# Patient Record
Sex: Female | Born: 1940 | Race: White | Hispanic: No | Smoking: Never smoker
Health system: Southern US, Community
[De-identification: ages and names within clinical notes are randomized; demographics above are authoritative.]

## PROBLEM LIST (undated history)

## (undated) DIAGNOSIS — R519 Headache, unspecified: Secondary | ICD-10-CM

## (undated) DIAGNOSIS — J189 Pneumonia, unspecified organism: Secondary | ICD-10-CM

## (undated) DIAGNOSIS — R29898 Other symptoms and signs involving the musculoskeletal system: Secondary | ICD-10-CM

## (undated) DIAGNOSIS — I469 Cardiac arrest, cause unspecified: Secondary | ICD-10-CM

## (undated) DIAGNOSIS — L309 Dermatitis, unspecified: Secondary | ICD-10-CM

## (undated) DIAGNOSIS — N189 Chronic kidney disease, unspecified: Secondary | ICD-10-CM

## (undated) DIAGNOSIS — I456 Pre-excitation syndrome: Secondary | ICD-10-CM

## (undated) DIAGNOSIS — R600 Localized edema: Secondary | ICD-10-CM

## (undated) DIAGNOSIS — T8859XA Other complications of anesthesia, initial encounter: Secondary | ICD-10-CM

## (undated) DIAGNOSIS — R51 Headache: Secondary | ICD-10-CM

## (undated) DIAGNOSIS — E039 Hypothyroidism, unspecified: Secondary | ICD-10-CM

## (undated) DIAGNOSIS — T4145XA Adverse effect of unspecified anesthetic, initial encounter: Secondary | ICD-10-CM

## (undated) HISTORY — PX: OTHER SURGICAL HISTORY: SHX169

## (undated) HISTORY — PX: CATARACT EXTRACTION: SUR2

## (undated) HISTORY — PX: ABDOMINAL HYSTERECTOMY: SHX81

## (undated) HISTORY — PX: BREAST ENHANCEMENT SURGERY: SHX7

---

## 1988-08-20 DIAGNOSIS — I469 Cardiac arrest, cause unspecified: Secondary | ICD-10-CM

## 1988-08-20 HISTORY — DX: Cardiac arrest, cause unspecified: I46.9

## 2014-09-20 ENCOUNTER — Emergency Department: Payer: Self-pay | Admitting: Emergency Medicine

## 2014-09-20 LAB — BASIC METABOLIC PANEL WITH GFR
Anion Gap: 5 — ABNORMAL LOW
BUN: 17 mg/dL
Calcium, Total: 9.2 mg/dL
Chloride: 103 mmol/L
Co2: 31 mmol/L
Creatinine: 1.18 mg/dL
EGFR (African American): 58 — ABNORMAL LOW
EGFR (Non-African Amer.): 47 — ABNORMAL LOW
Glucose: 160 mg/dL — ABNORMAL HIGH
Osmolality: 283
Potassium: 4.2 mmol/L
Sodium: 139 mmol/L

## 2014-09-20 LAB — CBC WITH DIFFERENTIAL/PLATELET
Basophil #: 0.1 10*3/uL (ref 0.0–0.1)
Basophil %: 0.9 %
Eosinophil #: 0.1 10*3/uL (ref 0.0–0.7)
Eosinophil %: 1.6 %
HCT: 40 % (ref 35.0–47.0)
HGB: 13.3 g/dL (ref 12.0–16.0)
Lymphocyte #: 0.6 10*3/uL — ABNORMAL LOW (ref 1.0–3.6)
Lymphocyte %: 10.7 %
MCH: 30.9 pg (ref 26.0–34.0)
MCHC: 33.3 g/dL (ref 32.0–36.0)
MCV: 93 fL (ref 80–100)
MONOS PCT: 6.5 %
Monocyte #: 0.4 x10 3/mm (ref 0.2–0.9)
Neutrophil #: 4.6 10*3/uL (ref 1.4–6.5)
Neutrophil %: 80.3 %
PLATELETS: 211 10*3/uL (ref 150–440)
RBC: 4.31 10*6/uL (ref 3.80–5.20)
RDW: 14.9 % — AB (ref 11.5–14.5)
WBC: 5.8 10*3/uL (ref 3.6–11.0)

## 2014-09-20 LAB — PROTIME-INR
INR: 1
Prothrombin Time: 13.5 s

## 2014-09-20 LAB — TROPONIN I: Troponin-I: <0.02 ng/mL

## 2014-09-20 LAB — APTT: Activated PTT: 29.3 s

## 2014-10-07 ENCOUNTER — Emergency Department: Payer: Self-pay | Admitting: Internal Medicine

## 2014-11-15 ENCOUNTER — Ambulatory Visit: Payer: Self-pay | Admitting: Internal Medicine

## 2014-12-21 ENCOUNTER — Other Ambulatory Visit: Payer: Self-pay

## 2014-12-22 ENCOUNTER — Encounter
Admission: RE | Admit: 2014-12-22 | Discharge: 2014-12-22 | Disposition: A | Discharge status: Home / Self Care | Payer: Medicare Other | Source: Ambulatory Visit | Attending: Urology | Admitting: Urology

## 2014-12-22 DIAGNOSIS — N2889 Other specified disorders of kidney and ureter: Secondary | ICD-10-CM | POA: Diagnosis not present

## 2014-12-22 DIAGNOSIS — Z01812 Encounter for preprocedural laboratory examination: Secondary | ICD-10-CM | POA: Insufficient documentation

## 2014-12-22 HISTORY — DX: Hypothyroidism, unspecified: E03.9

## 2014-12-22 HISTORY — DX: Other symptoms and signs involving the musculoskeletal system: R29.898

## 2014-12-22 HISTORY — DX: Chronic kidney disease, unspecified: N18.9

## 2014-12-22 HISTORY — DX: Localized edema: R60.0

## 2014-12-22 HISTORY — DX: Pre-excitation syndrome: I45.6

## 2014-12-22 HISTORY — DX: Dermatitis, unspecified: L30.9

## 2014-12-22 LAB — BASIC METABOLIC PANEL
Anion gap: 5 (ref 5–15)
BUN: 13 mg/dL (ref 6–20)
CALCIUM: 8.8 mg/dL — AB (ref 8.9–10.3)
CO2: 30 mmol/L (ref 22–32)
Chloride: 109 mmol/L (ref 101–111)
Creatinine, Ser: 0.81 mg/dL (ref 0.44–1.00)
GFR calc Af Amer: >60 mL/min (ref >60)
Glucose, Bld: 107 mg/dL — ABNORMAL HIGH (ref 65–99)
POTASSIUM: 3.9 mmol/L (ref 3.5–5.1)
SODIUM: 144 mmol/L (ref 135–145)

## 2014-12-22 LAB — URINALYSIS COMPLETE WITH MICROSCOPIC (ARMC ONLY)
Bilirubin Urine: NEGATIVE
Glucose, UA: NEGATIVE mg/dL
Ketones, ur: NEGATIVE mg/dL
Nitrite: NEGATIVE
PROTEIN: NEGATIVE mg/dL
SPECIFIC GRAVITY, URINE: 1.005 (ref 1.005–1.030)
pH: 7 (ref 5.0–8.0)

## 2014-12-22 LAB — CBC
HEMATOCRIT: 36 % (ref 35.0–47.0)
Hemoglobin: 11.8 g/dL — ABNORMAL LOW (ref 12.0–16.0)
MCH: 30.1 pg (ref 26.0–34.0)
MCHC: 32.8 g/dL (ref 32.0–36.0)
MCV: 91.7 fL (ref 80.0–100.0)
Platelets: 201 10*3/uL (ref 150–440)
RBC: 3.93 MIL/uL (ref 3.80–5.20)
RDW: 14.2 % (ref 11.5–14.5)
WBC: 5.4 10*3/uL (ref 3.6–11.0)

## 2014-12-22 NOTE — Patient Instructions (Signed)
  Your procedure is scheduled on: 12/28/14 Report to Day Surgery. To find out your arrival time please call [phone] between 1PM - 3PM on 12/27/14 Remember: Instructions that are not followed completely may result in serious medical risk, up to and including death, or upon the discretion of your surgeon and anesthesiologist your surgery may need to be rescheduled.    __x__ 1. Do not eat food or drink liquids after midnight. No gum chewing or hard candies.     ____ 2. No Alcohol for 24 hours before or after surgery.   ____ 3. Bring all medications with you on the day of surgery if instructed.    __x__ 4. Notify your doctor if there is any change in your medical condition     (cold, fever, infections).     Do not wear jewelry, make-up, hairpins, clips or nail polish.  Do not wear lotions, powders, or perfumes. You may wear deodorant.  Do not shave 48 hours prior to surgery. Men may shave face and neck.  Do not bring valuables to the hospital.    [facility] is not responsible for any belongings or valuables.               Contacts, dentures or bridgework may not be worn into surgery.  Leave your suitcase in the car. After surgery it may be brought to your room.  For patients admitted to the hospital, discharge time is determined by your                treatment team.   Patients discharged the day of surgery will not be allowed to drive home.   Please read over the following fact sheets that you were given:      ____ Take these medicines the morning of surgery with A SIP OF WATER:    1. gabapentin (NEURONTIN) 300 MG capsule 2. levothyroxine (SYNTHROID, LEVOTHROID) 200 MCG tablet  3. pantoprazole (PROTONIX) 40 MG tablet  4.  5.  6.  ____ Fleet Enema (as directed)   ____ Use CHG Soap as directed  ____ Use inhalers on the day of surgery  ____ Stop metformin 2 days prior to surgery    ____ Take 1/2 of usual insulin dose the night before surgery and none on the morning of  surgery.   ____ Stop Coumadin/Plavix/aspirin on   ____ Stop Anti-inflammatories on: today Meloxicam   ____ Stop supplements until after surgery.    ____ Bring C-Pap to the hospital.

## 2014-12-27 LAB — URINE CULTURE

## 2014-12-28 ENCOUNTER — Encounter
Admission: RE | Disposition: A | Discharge status: Home / Self Care | Payer: Self-pay | Source: Ambulatory Visit | Attending: Urology

## 2014-12-28 ENCOUNTER — Ambulatory Visit: Payer: Medicare Other | Admitting: Anesthesiology

## 2014-12-28 ENCOUNTER — Encounter: Payer: Self-pay | Admitting: Anesthesiology

## 2014-12-28 ENCOUNTER — Ambulatory Visit
Admission: RE | Admit: 2014-12-28 | Discharge: 2014-12-28 | Disposition: A | Discharge status: Home / Self Care | Payer: Medicare Other | Source: Ambulatory Visit | Attending: Urology | Admitting: Urology

## 2014-12-28 DIAGNOSIS — I456 Pre-excitation syndrome: Secondary | ICD-10-CM | POA: Insufficient documentation

## 2014-12-28 DIAGNOSIS — E039 Hypothyroidism, unspecified: Secondary | ICD-10-CM | POA: Diagnosis not present

## 2014-12-28 DIAGNOSIS — I5032 Chronic diastolic (congestive) heart failure: Secondary | ICD-10-CM | POA: Diagnosis not present

## 2014-12-28 DIAGNOSIS — R609 Edema, unspecified: Secondary | ICD-10-CM | POA: Insufficient documentation

## 2014-12-28 DIAGNOSIS — N3289 Other specified disorders of bladder: Secondary | ICD-10-CM

## 2014-12-28 DIAGNOSIS — Z9882 Breast implant status: Secondary | ICD-10-CM | POA: Insufficient documentation

## 2014-12-28 DIAGNOSIS — N183 Chronic kidney disease, stage 3 (moderate): Secondary | ICD-10-CM | POA: Diagnosis not present

## 2014-12-28 DIAGNOSIS — R319 Hematuria, unspecified: Secondary | ICD-10-CM | POA: Diagnosis not present

## 2014-12-28 DIAGNOSIS — Z79899 Other long term (current) drug therapy: Secondary | ICD-10-CM | POA: Diagnosis not present

## 2014-12-28 DIAGNOSIS — I517 Cardiomegaly: Secondary | ICD-10-CM | POA: Diagnosis not present

## 2014-12-28 DIAGNOSIS — N200 Calculus of kidney: Secondary | ICD-10-CM | POA: Diagnosis not present

## 2014-12-28 DIAGNOSIS — Z90710 Acquired absence of both cervix and uterus: Secondary | ICD-10-CM | POA: Insufficient documentation

## 2014-12-28 DIAGNOSIS — N2889 Other specified disorders of kidney and ureter: Secondary | ICD-10-CM | POA: Diagnosis present

## 2014-12-28 HISTORY — PX: TRANSURETHRAL RESECTION OF BLADDER TUMOR: SHX2575

## 2014-12-28 HISTORY — PX: CYSTOSCOPY W/ RETROGRADES: SHX1426

## 2014-12-28 SURGERY — TURBT (TRANSURETHRAL RESECTION OF BLADDER TUMOR)
Anesthesia: General | Wound class: Clean Contaminated

## 2014-12-28 MED ORDER — ONDANSETRON HCL 4 MG/2ML IJ SOLN
INTRAMUSCULAR | Status: DC | PRN
  Administered 2014-12-28: 4 mg via INTRAVENOUS

## 2014-12-28 MED ORDER — STERILE WATER FOR IRRIGATION IR SOLN
Status: DC | PRN
  Administered 2014-12-28: 4000 mL

## 2014-12-28 MED ORDER — IOTHALAMATE MEGLUMINE 43 % IV SOLN
INTRAVENOUS | Status: DC | PRN
  Administered 2014-12-28: 30 mL

## 2014-12-28 MED ORDER — OXYBUTYNIN CHLORIDE 5 MG PO TABS: 5.0000 mg | ORAL_TABLET | Freq: Three times a day (TID) | ORAL | Status: DC | PRN

## 2014-12-28 MED ORDER — LACTATED RINGERS IV SOLN
INTRAVENOUS | Status: DC
  Administered 2014-12-28: 10:00:00 via INTRAVENOUS

## 2014-12-28 MED ORDER — HYDROCODONE-ACETAMINOPHEN 5-325 MG PO TABS: 1.0000 | ORAL_TABLET | Freq: Four times a day (QID) | ORAL | Status: DC | PRN

## 2014-12-28 MED ORDER — SODIUM CHLORIDE 0.9 % IR SOLN
Status: DC | PRN
Start: 2014-12-28 — End: 2014-12-28
  Administered 2014-12-28: 3000 mL

## 2014-12-28 MED ORDER — FENTANYL CITRATE (PF) 100 MCG/2ML IJ SOLN: 25.0000 ug | INTRAMUSCULAR | Status: DC | PRN

## 2014-12-28 MED ORDER — PROPOFOL 10 MG/ML IV BOLUS
INTRAVENOUS | Status: DC | PRN
  Administered 2014-12-28: 120 mg via INTRAVENOUS

## 2014-12-28 MED ORDER — MIDAZOLAM HCL 2 MG/2ML IJ SOLN
INTRAMUSCULAR | Status: DC | PRN
  Administered 2014-12-28: 1 mg via INTRAVENOUS

## 2014-12-28 MED ORDER — LEVOFLOXACIN IN D5W 500 MG/100ML IV SOLN
500.0000 mg | Freq: Once | INTRAVENOUS | Status: AC
  Administered 2014-12-28: 10:00:00 via INTRAVENOUS

## 2014-12-28 MED ORDER — LEVOFLOXACIN IN D5W 500 MG/100ML IV SOLN
INTRAVENOUS | Status: AC
  Filled 2014-12-28: qty 100

## 2014-12-28 MED ORDER — PHENAZOPYRIDINE HCL 200 MG PO TABS
200.0000 mg | ORAL_TABLET | Freq: Three times a day (TID) | ORAL | Status: DC | PRN
Start: 2014-12-28 — End: 2015-02-16

## 2014-12-28 MED ORDER — LEVOFLOXACIN IN D5W 500 MG/100ML IV SOLN: 400.0000 mg | Freq: Once | INTRAVENOUS | Status: DC

## 2014-12-28 MED ORDER — FENTANYL CITRATE (PF) 100 MCG/2ML IJ SOLN
INTRAMUSCULAR | Status: DC | PRN
  Administered 2014-12-28 (×4): 25 ug via INTRAVENOUS

## 2014-12-28 MED ORDER — ONDANSETRON HCL 4 MG/2ML IJ SOLN: 4.0000 mg | Freq: Once | INTRAMUSCULAR | Status: DC | PRN

## 2014-12-28 SURGICAL SUPPLY — 33 items
BAG DRAIN CYSTO-URO LG1000N (MISCELLANEOUS) ×4 IMPLANT
BAG URO DRAIN 2000ML W/SPOUT (MISCELLANEOUS) ×4 IMPLANT
CATH FOLEY 2WAY  5CC 16FR (CATHETERS)
CATH URETL 5X70 OPEN END (CATHETERS) ×4 IMPLANT
CATH URTH 16FR FL 2W BLN LF (CATHETERS) IMPLANT
CONRAY 43 FOR UROLOGY 50M (MISCELLANEOUS) ×4 IMPLANT
CORD URO TURP 10FT (MISCELLANEOUS) ×4 IMPLANT
ELECT LOOP MED HF 24F 12D (CUTTING LOOP) ×4 IMPLANT
ELECT URO BUGBEE 5F (MISCELLANEOUS) ×4
ELECTRODE URO BUGBEE 5F (MISCELLANEOUS) ×2 IMPLANT
GLOVE BIO SURGEON STRL SZ 6.5 (GLOVE) ×6 IMPLANT
GLOVE BIO SURGEON STRL SZ7 (GLOVE) ×8 IMPLANT
GLOVE BIO SURGEONS STRL SZ 6.5 (GLOVE) ×2
GOWN STRL REUS W/ TWL LRG LVL3 (GOWN DISPOSABLE) ×4 IMPLANT
GOWN STRL REUS W/TWL LRG LVL3 (GOWN DISPOSABLE) ×4
JELLY LUB 2OZ STRL (MISCELLANEOUS) ×2
JELLY LUBE 2OZ STRL (MISCELLANEOUS) ×2 IMPLANT
LOOP CUT RT ANGL 28F (MISCELLANEOUS) ×4 IMPLANT
PACK CYSTO AR (MISCELLANEOUS) ×4 IMPLANT
PAD GROUND ADULT SPLIT (MISCELLANEOUS) ×4 IMPLANT
PREP PVP WINGED SPONGE (MISCELLANEOUS) ×4 IMPLANT
ROLLER BALL 3MM 24FR (ELECTROSURGICAL) IMPLANT
SENSORWIRE 0.038 NOT ANGLED (WIRE) ×8
SET CYSTO W/LG BORE CLAMP LF (SET/KITS/TRAYS/PACK) ×4 IMPLANT
SET IRRIG Y TYPE TUR BLADDER L (SET/KITS/TRAYS/PACK) ×4 IMPLANT
SOL .9 NS 3000ML IRR  AL (IV SOLUTION) ×4
SOL .9 NS 3000ML IRR UROMATIC (IV SOLUTION) ×4 IMPLANT
STENT URET 6FRX24 CONTOUR (STENTS) IMPLANT
STENT URET 6FRX26 CONTOUR (STENTS) IMPLANT
SYRINGE IRR TOOMEY STRL 70CC (SYRINGE) ×4 IMPLANT
WATER STERILE IRR 1000ML POUR (IV SOLUTION) ×4 IMPLANT
WATER STERILE IRR 3000ML UROMA (IV SOLUTION) ×8 IMPLANT
WIRE SENSOR 0.038 NOT ANGLED (WIRE) ×4 IMPLANT

## 2014-12-28 NOTE — Anesthesia Postprocedure Evaluation (Signed)
  Anesthesia Post-op Note  Patient: Marie Mullen  Procedure(s) Performed: Procedure(s): TRANSURETHRAL RESECTION OF BLADDER TUMOR (TURBT) (N/A) CYSTOSCOPY WITH RETROGRADE PYELOGRAM (Bilateral)  Anesthesia type:General  Patient location: PACU  Post pain: Pain level controlled  Post assessment: Post-op Vital signs reviewed, Patient's Cardiovascular Status Stable, Respiratory Function Stable, Patent Airway and No signs of Nausea or vomiting  Post vital signs: Reviewed and stable  Last Vitals:  Filed Vitals:   12/28/14 1136  BP:   Pulse: 83  Temp: 36 C  Resp: 14    Level of consciousness: awake, alert  and patient cooperative  Complications: No apparent anesthesia complications

## 2014-12-28 NOTE — Brief Op Note (Signed)
12/28/2014  11:22 AM  PATIENT:  Marie RossettiPolly G Mullen  74 y.o. female  PRE-OPERATIVE DIAGNOSIS:  Bladder mass, hematuria  POST-OPERATIVE DIAGNOSIS:  Bladder mass, hematuria  PROCEDURE:  Procedure(s): TRANSURETHRAL RESECTION OF BLADDER TUMOR (TURBT) (N/A) CYSTOSCOPY WITH RETROGRADE PYELOGRAM (Bilateral)  SURGEON:  Surgeon(s) and Role:    * Vanna ScotlandAshley Vernel Langenderfer, MD - Primary  ASSISTANTS: none   ANESTHESIA:   general  EBL:     none  Specimen: bladder biopsy x 3 ( anterior bladder, trigone, left lateral wall)  COUNTS:  YES  PLAN OF CARE: Discharge to home after PACU  PATIENT DISPOSITION:  PACU - hemodynamically stable.

## 2014-12-28 NOTE — Discharge Instructions (Signed)
Transurethral Resection of Bladder Tumor (TURBT) or Bladder Biopsy ° ° °Definition: ° Transurethral Resection of the Bladder Tumor is a surgical procedure used to diagnose and remove tumors within the bladder. TURBT is the most common treatment for early stage bladder cancer. ° °General instructions: °   ° Your recent bladder surgery requires very little post hospital care but some definite precautions. ° °Despite the fact that no skin incisions were used, the area around the bladder incisions are raw and covered with scabs to promote healing and prevent bleeding. Certain precautions are needed to insure that the scabs are not disturbed over the next 2-4 weeks while the healing proceeds. ° °Because the raw surface inside your bladder and the irritating effects of urine you may expect frequency of urination and/or urgency (a stronger desire to urinate) and perhaps even getting up at night more often. This will usually resolve or improve slowly over the healing period. You may see some blood in your urine over the first 6 weeks. Do not be alarmed, even if the urine was clear for a while. Get off your feet and drink lots of fluids until clearing occurs. If you start to pass clots or don't improve call us. ° °Diet: ° °You may return to your normal diet immediately. Because of the raw surface of your bladder, alcohol, spicy foods, foods high in acid and drinks with caffeine may cause irritation or frequency and should be used in moderation. To keep your urine flowing freely and avoid constipation, drink plenty of fluids during the day (8-10 glasses). Tip: Avoid cranberry juice because it is very acidic. ° °Activity: ° °Your physical activity doesn't need to be restricted. However, if you are very active, you may see some blood in the urine. We suggest that you reduce your activity under the circumstances until the bleeding has stopped. ° °Bowels: ° °It is important to keep your bowels regular during the postoperative  period. Straining with bowel movements can cause bleeding. A bowel movement every other day is reasonable. Use a mild laxative if needed, such as milk of magnesia 2-3 tablespoons, or 2 Dulcolax tablets. Call if you continue to have problems. If you had been taking narcotics for pain, before, during or after your surgery, you may be constipated. Take a laxative if necessary. ° ° ° °Medication: ° °You should resume your pre-surgery medications unless told not to. In addition you may be given an antibiotic to prevent or treat infection. Antibiotics are not always necessary. All medication should be taken as prescribed until the bottles are finished unless you are having an unusual reaction to one of the drugs. ° ° °[facility] °[address], [address] °[place], [place] [address] °[phone] ° ° ° ° °

## 2014-12-28 NOTE — Anesthesia Procedure Notes (Signed)
Procedure Name: LMA Insertion Date/Time: 12/28/2014 10:23 AM Performed by: Lily KocherPERALTA, Labarron Durnin Patient Re-evaluated:Patient Re-evaluated prior to inductionOxygen Delivery Method: Circle system utilized Preoxygenation: Pre-oxygenation with 100% oxygen Intubation Type: IV induction Ventilation: Mask ventilation without difficulty LMA: LMA inserted LMA Size: 4.0 Number of attempts: 1 Placement Confirmation: positive ETCO2 and breath sounds checked- equal and bilateral Tube secured with: Tape Dental Injury: Teeth and Oropharynx as per pre-operative assessment

## 2014-12-28 NOTE — Transfer of Care (Signed)
Immediate Anesthesia Transfer of Care Note  Patient: Marie Mullen  Procedure(s) Performed: Procedure(s): TRANSURETHRAL RESECTION OF BLADDER TUMOR (TURBT) (N/A) CYSTOSCOPY WITH RETROGRADE PYELOGRAM (Bilateral)  Patient Location: PACU  Anesthesia Type:General  Level of Consciousness: awake, alert  and oriented  Airway & Oxygen Therapy: Patient Spontanous Breathing and Patient connected to face mask oxygen  Post-op Assessment: Report given to RN  Post vital signs: Reviewed and stable  Last Vitals:  Filed Vitals:   12/28/14 1136  BP: 132/77  Pulse: 83  Temp: 36 C  Resp: 14    Complications: No apparent anesthesia complications

## 2014-12-28 NOTE — Anesthesia Preprocedure Evaluation (Addendum)
Anesthesia Evaluation  Patient identified by MRN, date of birth, ID band Patient awake    Reviewed: Allergy & Precautions, NPO status , Patient's Chart, lab work & pertinent test results  Airway Mallampati: II  TM Distance: >3 FB Neck ROM: Full    Dental  (+) Partial Upper, Dental Advisory Given   Pulmonary neg pulmonary ROS,  breath sounds clear to auscultation  Pulmonary exam normal       Cardiovascular + dysrhythmias Supra Ventricular Tachycardia Rhythm:Regular Rate:Normal  WPW syndrome.... [facility]   Neuro/Psych Weakness of both legs    GI/Hepatic negative GI ROS,   Endo/Other  Hypothyroidism   Renal/GU Renal InsufficiencyRenal disease   Bladder mass    Musculoskeletal  (+) Arthritis -, Osteoarthritis,    Abdominal Normal abdominal exam  (+)   Peds negative pediatric ROS (+)  Hematology negative hematology ROS (+)   Anesthesia Other Findings   Reproductive/Obstetrics                           Anesthesia Physical Anesthesia Plan  ASA: III  Anesthesia Plan: General   Post-op Pain Management:    Induction: Intravenous  Airway Management Planned: Oral ETT  Additional Equipment:   Intra-op Plan:   Post-operative Plan: Extubation in OR  Informed Consent: I have reviewed the patients History and Physical, chart, labs and discussed the procedure including the risks, benefits and alternatives for the proposed anesthesia with the patient or authorized representative who has indicated his/her understanding and acceptance.   Dental advisory given  Plan Discussed with: CRNA and Surgeon  Anesthesia Plan Comments:         Anesthesia Quick Evaluation

## 2014-12-28 NOTE — Interval H&P Note (Signed)
History and Physical Interval Note:  12/28/2014 10:17 AM  Marie Mullen  has presented today for surgery, with the diagnosis of BLADDER MASS  The various methods of treatment have been discussed with the patient and family. After consideration of risks, benefits and other options for treatment, the patient has consented to  Procedure(s): TRANSURETHRAL RESECTION OF BLADDER TUMOR (TURBT) (N/A) CYSTOSCOPY WITH RETROGRADE PYELOGRAM (Bilateral) as a surgical intervention .  The patient's history has been reviewed, patient examined, no change in status, stable for surgery.  I have reviewed the patient's chart and labs.  Questions were answered to the patient's satisfaction.     Vanna ScotlandBRANDON, Ariabella Brien

## 2014-12-28 NOTE — H&P (Signed)
See paper H&P 

## 2014-12-29 LAB — SURGICAL PATHOLOGY

## 2014-12-30 ENCOUNTER — Encounter: Payer: Self-pay | Admitting: Urology

## 2014-12-30 NOTE — Op Note (Signed)
NAMAllene Mullen:  Mullen, Marie                 ACCOUNT NO.:  [id number]  MEDICAL RECORD NO.:  [id number]  LOCATION:  [facility]                         FACILITY:  [facility]  PHYSICIAN:  Vanna ScotlandAshley Tyrese Ficek, MD     DATE OF BIRTH:  1941-02-14  DATE OF PROCEDURE:  12/28/2014 DATE OF DISCHARGE:  12/28/2014                              OPERATIVE REPORT   PREOPERATIVE DIAGNOSIS: 1. Bladder mass. 2. Hematuria.  POSTOPERATIVE DIAGNOSIS: 1. Bladder mass. 2. Hematuria.  PROCEDURE PERFORMED: 1. Cystoscopy. 2. Bilateral retrograde pyelogram. 3. Transurethral resection of bladder tumor (small).  SURGEON:  Vanna ScotlandAshley Chau Savell, MD  ANESTHESIA:  General anesthesia.  ESTIMATED BLOOD LOSS:  Minimal.  DRAINS:  None.  COMPLICATIONS:  None.  SPECIMENS:  Bladder lesions x3 (trigone, left lateral wall, interior bladder).  INDICATION:  This is a 74 year old female who presented for workup of hematuria and presumably recurrent UTIs.  She underwent a renal bladder ultrasound which was suggestive of a nodule in the left lateral wall of the bladder.  Given this finding, she was recommended to undergo cystoscopy, possible TURBT, and bilateral retrogrades for further workup.  Risks and benefits of the procedure were explained in detail to the patient who agreed to proceed with this planned procedure.  DESCRIPTION OF PROCEDURE:  The patient was correctly identified in the preoperative holding area and informed consent was confirmed.  She was brought to the operating suite and placed on the table in a supine position.  At this time the universal time-out protocol was performed. All team members were identified.  Venodyne boots were placed and she was administered IV antibiotics in the perioperative period.  She was then placed under general anesthesia, repositioned lower on the bed in the dorsal lithotomy position and prepped and draped in the standard surgical fashion.  A rigid cystoscope using the 22-French access  sheath was first advanced per urethra into the bladder and the bladder was surveyed.  Of note there was a significant amount of whitish flaky debris in the bladder which was emptied several times to clear the bladder.  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  This involved the trigone in between the UO's, extending up the posterior wall of the bladder, anterior wall of the bladder and most severely the left lateral wall of the bladder.  The appearance of the surface of the mucosa was a flaky white coating on the left lateral wall of the bladder.  This was relatively heaped up involving approximately 1 cm area concerning for a possible bladder mass at this site.  There was also some other chronic appearing changes in her bladder including relatively prominent collagen fibers and friable mucosa.  There were no obvious papillary tumors identified throughout the bladder.  There were no stones or ulcerations noted.  Attention was then turned to the left ureteral orifice which was cannulated using a 5- [place]French open-ended ureteral catheter.  The catheter was injected with contrast at which time the ureter appeared to be delicate without filling defects.  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  Attention was then turned to the right ureteral orifice and the same procedure was performed.  A retrograde pyelogram was performed of the right again revealing a delicate appearing right ureter without hydronephrosis or obvious filling defects.  Again on this side very much symmetric to the left, the collecting system appeared to be slightly  malrotated and somewhat unusual configuration.  These images were saved to the PACS for review at a later date.  Unfortunately, given the bed I was unable to oblique her to adjust for any anterior/posterior filling defects.  At this point in time cold cup biopsy forceps were used to biopsy and resect suspicious areas of the bladder.  Several biopsies were taken at the bladder neck trigone area, which were passed off and labeled as such.  One biopsy was taken on the anterior surface of the bladder in a particularly suspicious area.  Multiple bites were then taken from the heaped up flaky white area on the left lateral wall of the bladder, at least down to an area which appeared to contain muscle.  The saline resectoscope was then brought in and hemostasis was achieved both of the trigone and anterior bladder biopsy.  The cautery loop was then used on the left lateral bladder lesion to obliterate the majority of the suspicious areas in this location.  As there were diffuse changes throughout the bladder, there were other suspicious areas throughout the bladder, which were not completely obliterated or resected given the unknown pathology.  At this point in time the water was able to be turned off and hemostasis seemed to be adequate without any active bleeding.  All specimens were sent off.  The scope was then removed.  The patient was reversed from anesthesia after being repositioned in the supine position and taken to the PACU in stable condition.  There were no complications of the case.  PLAN:  The patient will follow up in 1-2 weeks to review her pathology results.  I would recommend pursuing a CT urogram if possible to further assess her upper tract given the somewhat unusual angulation and geometry of her upper tracts bilaterally, although this is symmetric, which is less concerning.          ______________________________ Vanna ScotlandAshley Leanna Hamid, MD     AB/MEDQ  D:  12/29/2014  T:   12/30/2014  Job:  [id number]

## 2015-01-25 ENCOUNTER — Other Ambulatory Visit: Payer: Self-pay

## 2015-02-15 ENCOUNTER — Other Ambulatory Visit: Payer: Self-pay | Admitting: Urology

## 2015-02-15 NOTE — Patient Instructions (Signed)
Marie Mullen  02/15/2015   Your procedure is scheduled on:     02/18/2015    [facility]  [place]tr[place]vators to 3rd floor to  Short Stay Center at     0930 AM.  Call this number if you have problems the morning of surgery [phone]   Remember: ONLY 1 PERSON MAY GO WITH YOU TO SHORT STAY TO GET  READY MORNING OF YOUR SURGERY.  Do not eat food or drink liquids :After Midnight.     Take these medicines the morning of surgery with A SIP OF WATER:   SYNTHROID, DITROOPAN IF NEEDED, pYRIDIUM IF NEEDED                                You may not have any metal on your body including hair pins and              piercings  Do not wear jewelry, make-up, lotions, powders or perfumes, deodorant             Do not wear nail polish.  Do not shave  48 hours prior to surgery.                 Do not bring valuables to the hospital. [facility] IS NOT             RESPONSIBLE   FOR VALUABLES.  Contacts, dentures or bridgework may not be worn into surgery.  .     Patients discharged the day of surgery will not be allowed to drive home.  Name and phone number of your driver:  Special Instructions: COUGHING AND DEEP BREATHING EXERCISES, LEG EXERCISES               Please read over the following fact sheets you were given: _____________________________________________________________________             [facility] - Preparing for Surgery Before surgery, you can play an important role.  Because skin is not sterile, your skin needs to be as free of germs as possible.  You can reduce the number of germs on your skin by washing with CHG (chlorahexidine gluconate) soap before surgery.  CHG is an antiseptic cleaner which kills germs and bonds with the skin to continue killing germs even after washing. Please DO NOT use if you have an allergy to CHG or antibacterial soaps.  If your skin becomes reddened/irritated stop using the CHG and inform your nurse when you  arrive at Short Stay. Do not shave (including legs and underarms) for at least 48 hours prior to the first CHG shower.  You may shave your face/neck. Please follow these instructions carefully:  1.  Shower with CHG Soap the night before surgery and the  morning of Surgery.  2.  If you choose to wash your hair, wash your hair first as usual with your  normal  shampoo.  3.  After you shampoo, rinse your hair and body thoroughly to remove the  shampoo.                           4.  Use CHG as you would any other liquid soap.  You can apply chg directly  to the skin and wash  Gently with a scrungie or clean washcloth.  5.  Apply the CHG Soap to your body ONLY FROM THE NECK DOWN.   Do not use on face/ open                           Wound or open sores. Avoid contact with eyes, ears mouth and genitals (private parts).                       Wash face,  Genitals (private parts) with your normal soap.             6.  Wash thoroughly, paying special attention to the area where your surgery  will be performed.  7.  Thoroughly rinse your body with warm water from the neck down.  8.  DO NOT shower/wash with your normal soap after using and rinsing off  the CHG Soap.                9.  Pat yourself dry with a clean towel.            10.  Wear clean pajamas.            11.  Place clean sheets on your bed the night of your first shower and do not  sleep with pets. Day of Surgery : Do not apply any lotions/deodorants the morning of surgery.  Please wear clean clothes to the hospital/surgery center.  FAILURE TO FOLLOW THESE INSTRUCTIONS MAY RESULT IN THE CANCELLATION OF YOUR SURGERY PATIENT SIGNATURE_________________________________  NURSE SIGNATURE__________________________________  ________________________________________________________________________

## 2015-02-15 NOTE — Progress Notes (Signed)
Called for release of orders in Epic surgery 02-18-15 pre op 02-16-15 Thanks

## 2015-02-16 ENCOUNTER — Inpatient Hospital Stay (HOSPITAL_COMMUNITY)
Admission: RE | Admit: 2015-02-16 | Discharge: 2015-02-16 | Disposition: A | Discharge status: Home / Self Care | Payer: Medicare Other | Source: Ambulatory Visit

## 2015-02-16 ENCOUNTER — Encounter (HOSPITAL_COMMUNITY): Payer: Self-pay | Admitting: *Deleted

## 2015-02-18 ENCOUNTER — Ambulatory Visit (HOSPITAL_COMMUNITY): Admission: RE | Admit: 2015-02-18 | Payer: Medicare Other | Source: Ambulatory Visit | Admitting: Urology

## 2015-02-18 HISTORY — DX: Pneumonia, unspecified organism: J18.9

## 2015-02-18 HISTORY — DX: Cardiac arrest, cause unspecified: I46.9

## 2015-02-18 HISTORY — DX: Adverse effect of unspecified anesthetic, initial encounter: T41.45XA

## 2015-02-18 HISTORY — DX: Headache: R51

## 2015-02-18 HISTORY — DX: Headache, unspecified: R51.9

## 2015-02-18 HISTORY — DX: Other complications of anesthesia, initial encounter: T88.59XA

## 2015-02-18 SURGERY — TRANSURETHRAL RESECTION OF BLADDER TUMOR WITH GYRUS (TURBT-GYRUS)
Anesthesia: General

## 2015-07-06 ENCOUNTER — Emergency Department: Payer: Medicare Other

## 2015-07-06 ENCOUNTER — Inpatient Hospital Stay
Admission: EM | Admit: 2015-07-06 | Discharge: 2015-07-09 | DRG: 871 | Disposition: A | Discharge status: Skilled Nursing Facility | Payer: Medicare Other | Attending: Internal Medicine | Admitting: Internal Medicine

## 2015-07-06 ENCOUNTER — Encounter: Payer: Self-pay | Admitting: Emergency Medicine

## 2015-07-06 DIAGNOSIS — F0391 Unspecified dementia with behavioral disturbance: Secondary | ICD-10-CM | POA: Diagnosis present

## 2015-07-06 DIAGNOSIS — N3090 Cystitis, unspecified without hematuria: Secondary | ICD-10-CM | POA: Diagnosis present

## 2015-07-06 DIAGNOSIS — Z8674 Personal history of sudden cardiac arrest: Secondary | ICD-10-CM

## 2015-07-06 DIAGNOSIS — N3289 Other specified disorders of bladder: Secondary | ICD-10-CM

## 2015-07-06 DIAGNOSIS — N10 Acute tubulo-interstitial nephritis: Secondary | ICD-10-CM | POA: Diagnosis present

## 2015-07-06 DIAGNOSIS — E876 Hypokalemia: Secondary | ICD-10-CM | POA: Diagnosis present

## 2015-07-06 DIAGNOSIS — G9341 Metabolic encephalopathy: Secondary | ICD-10-CM

## 2015-07-06 DIAGNOSIS — R410 Disorientation, unspecified: Secondary | ICD-10-CM | POA: Diagnosis present

## 2015-07-06 DIAGNOSIS — W19XXXA Unspecified fall, initial encounter: Secondary | ICD-10-CM | POA: Diagnosis present

## 2015-07-06 DIAGNOSIS — R531 Weakness: Secondary | ICD-10-CM | POA: Diagnosis present

## 2015-07-06 DIAGNOSIS — Z79899 Other long term (current) drug therapy: Secondary | ICD-10-CM | POA: Diagnosis not present

## 2015-07-06 DIAGNOSIS — L309 Dermatitis, unspecified: Secondary | ICD-10-CM | POA: Diagnosis present

## 2015-07-06 DIAGNOSIS — E039 Hypothyroidism, unspecified: Secondary | ICD-10-CM | POA: Diagnosis present

## 2015-07-06 DIAGNOSIS — A419 Sepsis, unspecified organism: Principal | ICD-10-CM | POA: Diagnosis present

## 2015-07-06 DIAGNOSIS — N182 Chronic kidney disease, stage 2 (mild): Secondary | ICD-10-CM | POA: Diagnosis present

## 2015-07-06 DIAGNOSIS — Z7982 Long term (current) use of aspirin: Secondary | ICD-10-CM

## 2015-07-06 DIAGNOSIS — D72829 Elevated white blood cell count, unspecified: Secondary | ICD-10-CM

## 2015-07-06 DIAGNOSIS — N329 Bladder disorder, unspecified: Secondary | ICD-10-CM | POA: Diagnosis present

## 2015-07-06 DIAGNOSIS — I456 Pre-excitation syndrome: Secondary | ICD-10-CM | POA: Diagnosis present

## 2015-07-06 LAB — URINALYSIS COMPLETE WITH MICROSCOPIC (ARMC ONLY)
BILIRUBIN URINE: NEGATIVE
Glucose, UA: NEGATIVE mg/dL
NITRITE: NEGATIVE
PH: 7 (ref 5.0–8.0)
Specific Gravity, Urine: 1.02 (ref 1.005–1.030)

## 2015-07-06 LAB — COMPREHENSIVE METABOLIC PANEL
ALBUMIN: 3.3 g/dL — AB (ref 3.5–5.0)
ALT: 15 U/L (ref 14–54)
ANION GAP: 13 (ref 5–15)
AST: 29 U/L (ref 15–41)
Alkaline Phosphatase: 105 U/L (ref 38–126)
BUN: 19 mg/dL (ref 6–20)
CHLORIDE: 102 mmol/L (ref 101–111)
CO2: 22 mmol/L (ref 22–32)
Calcium: 9.5 mg/dL (ref 8.9–10.3)
Creatinine, Ser: 0.66 mg/dL (ref 0.44–1.00)
GFR calc non Af Amer: >60 mL/min (ref >60)
GLUCOSE: 136 mg/dL — AB (ref 65–99)
POTASSIUM: 4.1 mmol/L (ref 3.5–5.1)
SODIUM: 137 mmol/L (ref 135–145)
Total Bilirubin: 1.3 mg/dL — ABNORMAL HIGH (ref 0.3–1.2)
Total Protein: 6.9 g/dL (ref 6.5–8.1)

## 2015-07-06 LAB — CBC WITH DIFFERENTIAL/PLATELET
BASOS ABS: 0 10*3/uL (ref 0–0.1)
BASOS PCT: 0 %
Eosinophils Absolute: 0 10*3/uL (ref 0–0.7)
Eosinophils Relative: 0 %
HEMATOCRIT: 41.6 % (ref 35.0–47.0)
Hemoglobin: 13.8 g/dL (ref 12.0–16.0)
LYMPHS PCT: 5 %
Lymphs Abs: 0.6 10*3/uL — ABNORMAL LOW (ref 1.0–3.6)
MCH: 30.6 pg (ref 26.0–34.0)
MCHC: 33.1 g/dL (ref 32.0–36.0)
MCV: 92.5 fL (ref 80.0–100.0)
MONO ABS: 1.1 10*3/uL — AB (ref 0.2–0.9)
Monocytes Relative: 8 %
NEUTROS ABS: 11.7 10*3/uL — AB (ref 1.4–6.5)
Neutrophils Relative %: 87 %
PLATELETS: 267 10*3/uL (ref 150–440)
RBC: 4.5 MIL/uL (ref 3.80–5.20)
RDW: 13.6 % (ref 11.5–14.5)
WBC: 13.5 10*3/uL — AB (ref 3.6–11.0)

## 2015-07-06 LAB — ETHANOL: Alcohol, Ethyl (B): <5 mg/dL (ref <5)

## 2015-07-06 LAB — CK: Total CK: 461 U/L — ABNORMAL HIGH (ref 38–234)

## 2015-07-06 LAB — TROPONIN I

## 2015-07-06 LAB — LIPASE, BLOOD: LIPASE: 17 U/L (ref 11–51)

## 2015-07-06 MED ORDER — DEXTROSE 5 % IV SOLN
1.0000 g | INTRAVENOUS | Status: DC
  Filled 2015-07-06: qty 10

## 2015-07-06 MED ORDER — FOSFOMYCIN TROMETHAMINE 3 G PO PACK
3.0000 g | PACK | Freq: Once | ORAL | Status: AC
  Administered 2015-07-06: 3 g via ORAL
  Filled 2015-07-06: qty 3

## 2015-07-06 MED ORDER — ENOXAPARIN SODIUM 40 MG/0.4ML ~~LOC~~ SOLN
40.0000 mg | SUBCUTANEOUS | Status: DC
  Administered 2015-07-06 – 2015-07-08 (×3): 40 mg via SUBCUTANEOUS
  Filled 2015-07-06 (×4): qty 0.4

## 2015-07-06 MED ORDER — ACETAMINOPHEN 325 MG PO TABS
650.0000 mg | ORAL_TABLET | Freq: Four times a day (QID) | ORAL | Status: DC | PRN
  Administered 2015-07-08 – 2015-07-09 (×2): 650 mg via ORAL
  Filled 2015-07-06 (×2): qty 2

## 2015-07-06 MED ORDER — TORSEMIDE 20 MG PO TABS
40.0000 mg | ORAL_TABLET | Freq: Every day | ORAL | Status: DC
  Administered 2015-07-06: 18:00:00 40 mg via ORAL
  Filled 2015-07-06 (×2): qty 2

## 2015-07-06 MED ORDER — SODIUM CHLORIDE 0.9 % IV BOLUS (SEPSIS)
1000.0000 mL | Freq: Once | INTRAVENOUS | Status: AC
  Administered 2015-07-06: 1000 mL via INTRAVENOUS

## 2015-07-06 MED ORDER — POTASSIUM CHLORIDE CRYS ER 10 MEQ PO TBCR
10.0000 meq | EXTENDED_RELEASE_TABLET | Freq: Every day | ORAL | Status: DC
  Administered 2015-07-06 – 2015-07-07 (×2): 10 meq via ORAL
  Filled 2015-07-06 (×2): qty 1

## 2015-07-06 MED ORDER — SODIUM CHLORIDE 0.9 % IJ SOLN
3.0000 mL | Freq: Two times a day (BID) | INTRAMUSCULAR | Status: DC
  Administered 2015-07-07 – 2015-07-09 (×5): 3 mL via INTRAVENOUS

## 2015-07-06 MED ORDER — ONDANSETRON HCL 4 MG/2ML IJ SOLN: 4.0000 mg | Freq: Four times a day (QID) | INTRAMUSCULAR | Status: DC | PRN

## 2015-07-06 MED ORDER — CEFTRIAXONE SODIUM 1 G IJ SOLR
1.0000 g | INTRAMUSCULAR | Status: DC
  Administered 2015-07-07 – 2015-07-08 (×2): 1 g via INTRAVENOUS
  Filled 2015-07-06 (×3): qty 10

## 2015-07-06 MED ORDER — DOCUSATE SODIUM 100 MG PO CAPS
100.0000 mg | ORAL_CAPSULE | Freq: Two times a day (BID) | ORAL | Status: DC
  Administered 2015-07-06 – 2015-07-08 (×5): 100 mg via ORAL
  Filled 2015-07-06 (×6): qty 1

## 2015-07-06 MED ORDER — SODIUM CHLORIDE 0.9 % IV SOLN
INTRAVENOUS | Status: DC
  Administered 2015-07-06: 18:00:00 via INTRAVENOUS

## 2015-07-06 MED ORDER — SODIUM CHLORIDE 0.9 % IV BOLUS (SEPSIS)
500.0000 mL | Freq: Once | INTRAVENOUS | Status: AC
  Administered 2015-07-06: 500 mL via INTRAVENOUS

## 2015-07-06 MED ORDER — DEXTROSE 5 % IV SOLN
1.0000 g | Freq: Once | INTRAVENOUS | Status: AC
Start: 2015-07-06 — End: 2015-07-06
  Administered 2015-07-06: 1 g via INTRAVENOUS
  Filled 2015-07-06: qty 10

## 2015-07-06 MED ORDER — ACETAMINOPHEN 650 MG RE SUPP: 650.0000 mg | Freq: Four times a day (QID) | RECTAL | Status: DC | PRN

## 2015-07-06 MED ORDER — ONDANSETRON HCL 4 MG PO TABS: 4.0000 mg | ORAL_TABLET | Freq: Four times a day (QID) | ORAL | Status: DC | PRN

## 2015-07-06 MED ORDER — LEVOTHYROXINE SODIUM 75 MCG PO TABS
150.0000 ug | ORAL_TABLET | Freq: Every day | ORAL | Status: DC
  Administered 2015-07-07: 09:00:00 150 ug via ORAL
  Filled 2015-07-06: qty 2

## 2015-07-06 NOTE — ED Notes (Signed)
Ems , pt from home increasing weakness, have been on the floor since Monday, cloths soaked with urine, pt arrives alert x2 , pt denies any head trauma , pt recalls having lower leg weakness for a while, emergency services has assisted pt multiple time from getting her off the  Floor .

## 2015-07-06 NOTE — ED Notes (Signed)
Pt came to ED soiled head to toe, pt changed completely, washed head to toe, clean sheets and clean gown given. Pt tolerated well, soiled clothes placed in pt's belongings bag and placed at bedside. Pt made aware and verbalized understanding.

## 2015-07-06 NOTE — H&P (Signed)
[facility] - [place] [facility]   PATIENT NAME: Marie Mullen    MR#:  [id number]  DATE OF BIRTH:  [birth year]  DATE OF ADMISSION:  07/06/2015  PRIMARY CARE PHYSICIAN: Lauro Regulus., MD   REQUESTING/REFERRING PHYSICIAN: [email or web address]   CHIEF COMPLAINT:  Generalized weakness  HISTORY OF PRESENT ILLNESS:  Marie Mullen  is a 74 y.o. female with a known history of hypothyroidism, WPW syndrome, chronic kidney disease and recent diagnosis of bladder mass status post biopsy is presenting to the ED with a chief complaint of generalized weakness. Patient has been feeling weak for the past few days and her legs are giving out on her. Patient sustained a fall 2 days ago and she was unable to get up on her own. History is taken from the patient's friend as patient seemed to be confused. No history of nausea or vomiting. Patient is diagnosed with UTI and she is started on IV ANTIBIOTICS  PAST MEDICAL HISTORY:   Past Medical History  Diagnosis Date  . Eczema   . Hypothyroidism   . WPW (Wolff-Parkinson-White syndrome)   . Chronic kidney disease   . Lower extremity edema   . Weakness of both legs     knees  . Complication of anesthesia     slow to wake up   . Cardiac arrest (HCC) 1990   . Pneumonia     hx of   . Headache     PAST SURGICAL HISTOIRY:   Past Surgical History  Procedure Laterality Date  . Abdominal hysterectomy    . Breast enhancement surgery Bilateral   . Wpw N/A   . Cataract extraction Bilateral   . Transurethral resection of bladder tumor N/A 12/28/2014    Procedure: TRANSURETHRAL RESECTION OF BLADDER TUMOR (TURBT);  Surgeon: Vanna Scotland, MD;  Location: [facility] ORS;  Service: Urology;  Laterality: N/A;  . Cystoscopy w/ retrogrades Bilateral 12/28/2014    Procedure: CYSTOSCOPY WITH RETROGRADE PYELOGRAM;  Surgeon: Vanna Scotland, MD;  Location: [facility] ORS;  Service: Urology;  Laterality: Bilateral;  . Heart mapping       SOCIAL HISTORY:    Social History  Substance Use Topics  . Smoking status: Never Smoker   . Smokeless tobacco: Never Used  . Alcohol Use: No    FAMILY HISTORY:  No family history on file.  DRUG ALLERGIES:  No Known Allergies  REVIEW OF SYSTEMS:  Review of systems unobtainable as the patient is without mental status  MEDICATIONS AT HOME:   Prior to Admission medications   Medication Sig Start Date End Date Taking? Authorizing Provider  levothyroxine (SYNTHROID, LEVOTHROID) 150 MCG tablet Take 150 mcg by mouth daily before breakfast.   Yes Historical Provider, MD  potassium chloride (K-DUR,KLOR-CON) 10 MEQ tablet Take 10 mEq by mouth daily.   Yes Historical Provider, MD  torsemide (DEMADEX) 20 MG tablet Take 40 mg by mouth daily.    Yes Historical Provider, MD      VITAL SIGNS:  Blood pressure 144/75, pulse 102, temperature 98.5 F (36.9 C), temperature source Oral, resp. rate 16, height 5' 4.5" (1.638 m), weight 94.348 kg (208 lb), SpO2 100 %.  PHYSICAL EXAMINATION:  GENERAL:  74 y.o.-year-old patient lying in the bed with no acute distress.  EYES: Pupils equal, round, reactive to light and accommodation. No scleral icterus.  HEENT: Head atraumatic, normocephalic. Oropharynx and nasopharynx clear.  NECK:  Supple, no jugular venous distention. No thyroid enlargement, no tenderness.  LUNGS: Normal breath sounds bilaterally, no  wheezing, rales,rhonchi or crepitation. No use of accessory muscles of respiration.  CARDIOVASCULAR: S1, S2 normal. No murmurs, rubs, or gallops.  ABDOMEN: Soft, nontender, nondistended. Bowel sounds present. No organomegaly or mass.  EXTREMITIES: No pedal edema, cyanosis, or clubbing.  NEUROLOGIC: Patient is with altered mental status.Gait not checked.  PSYCHIATRIC: The patient is alert but disoriented  SKIN: No obvious rash, lesion, or ulcer.   LABORATORY PANEL:   CBC  Recent Labs Lab 07/06/15 1118  WBC 13.5*  HGB 13.8  HCT 41.6  PLT 267    ------------------------------------------------------------------------------------------------------------------  Chemistries   Recent Labs Lab 07/06/15 1118  NA 137  K 4.1  CL 102  CO2 22  GLUCOSE 136*  BUN 19  CREATININE 0.66  CALCIUM 9.5  AST 29  ALT 15  ALKPHOS 105  BILITOT 1.3*   ------------------------------------------------------------------------------------------------------------------  Cardiac Enzymes  Recent Labs Lab 07/06/15 1118  TROPONINI <0.03   ------------------------------------------------------------------------------------------------------------------  RADIOLOGY:  Dg Chest 2 View  07/06/2015  CLINICAL DATA:  Patient was found down at home.  Weakness. EXAM: CHEST  2 VIEW COMPARISON:  09/20/2014 FINDINGS: Heart size and pulmonary vascularity are normal and the lungs are clear. Prominent left pericardial fat pad. Calcified breast implants. Diffuse osteopenia with accentuation of the thoracic kyphosis. No acute osseous abnormality. No effusions. IMPRESSION: No acute abnormalities. Electronically Signed   By: Francene BoyersJames  Maxwell M.D.   On: 07/06/2015 12:33   Dg Pelvis 1-2 Views  07/06/2015  CLINICAL DATA:  Generalized weakness.  Found on floor. EXAM: PELVIS - 1-2 VIEW COMPARISON:  None. FINDINGS: Single AP view of the pelvis demonstrates osteopenia. Femoral heads are located. No acute fracture. Sacroiliac joints are symmetric. IMPRESSION: No acute osseous abnormality. Electronically Signed   By: Jeronimo GreavesKyle  Talbot M.D.   On: 07/06/2015 12:30   Ct Head Wo Contrast  07/06/2015  CLINICAL DATA:  Increasing weakness with loss of consciousness for 2 days. Found down. Initial encounter. EXAM: CT HEAD WITHOUT CONTRAST TECHNIQUE: Contiguous axial images were obtained from the base of the skull through the vertex without intravenous contrast. COMPARISON:  None. FINDINGS: There is no evidence of acute intracranial hemorrhage, mass lesion, brain edema or extra-axial fluid  collection. The ventricles and subarachnoid spaces are mildly prominent for age. There is no CT evidence of acute cortical infarction. There is mild periventricular low-density, likely due to chronic small vessel ischemic change. There is a possible chronic infarct or prominent perivascular space in the right thalamus. Intracranial vascular calcifications are noted. The visualized paranasal sinuses, mastoid air cells and middle ears are clear. The calvarium is demineralized without evidence of acute fracture. IMPRESSION: No acute intracranial findings identified. Atrophy and probable mild chronic small vessel ischemic changes. Electronically Signed   By: Carey BullocksWilliam  Veazey M.D.   On: 07/06/2015 13:17    EKG:   Orders placed or performed during the hospital encounter of 07/06/15  . ED EKG  . ED EKG    IMPRESSION AND PLAN:    Patient has been feeling weak for the past few days and her legs are giving out on her. Patient sustained a fall 2 days ago and she was unable to get up on her own. History is taken from the patient's friend as patient seemed to be confused. No history of nausea or vomiting. Patient is diagnosed with UTI and she is started on IV antibiotics  1. Altered mental status secondary to acute cystitis We will get pan cultures and treat with antibiotics Provide IV fluids Get neurovascular checks  2. Sepsis-meets septic criteria with leukocytosis and tachycardia-secondary to acute cystitis Urine culture and sensitivity is ordered. Patient will be on IV Rocephin. Provide IV fluids Patient has received 1 dose of fosfomycin by the ED physician in the ED  3. Chronic history of hypothyroidism Check TSH in a.m. Patient's Synthroid was changed from 200-150 g by the primary care physician just 1 week ago. We will continue the current dose at 150 g  4. Generalized weakness Provide physical therapy for deconditioning  5. History of bladder mass Status post recent biopsy Patient is to  follow-up with her urology as an outpatient as recommended by them  Will provide GI and DVT prophylaxis     All the records are reviewed and case discussed with ED provider. Management plans discussed with the patient, family and they are in agreement.  CODE STATUS: Full code, this is the healthcare power of attorney  TOTAL TIME TAKING CARE OF THIS PATIENT: 45 minutes.    Ramonita Lab M.D on 07/06/2015 at 3:53 PM  Between 7am to 6pm - Pager - [phone]  After 6pm go to [email or web address] - password [email or web address] [facility]  [facility]  Office  [phone]  CC: Primary care physician; Lauro Regulus., MD

## 2015-07-06 NOTE — ED Notes (Signed)
Pt's sister took entire contents of pt's medicine bag home with her. Pt made aware and verbalized understanding at this time

## 2015-07-06 NOTE — ED Notes (Signed)
Patient transported to CT 

## 2015-07-06 NOTE — ED Provider Notes (Signed)
[facility] Emergency Department Provider Note  ____________________________________________  Time seen: 11:00 AM on arrival by EMS  I have reviewed the triage vital signs and the nursing notes.   HISTORY  Chief Complaint Weakness    HPI Marie Mullen is a 74 y.o. female brought to the ED by EMS due to being found on her floor in her home unable to get up. She's been feeling weak recently stating that her legs give out on her. 2 days ago she had fallen to the floor and was able to get back up. By department came to the home and helped her up and she refused to be transported to the emergency department. She then fell the next day and has been lying on her floor since yesterday. EMS reports that at home dialysis very dirty she is a border and she was soaked with urine and stool. Patient denies acute symptoms other than her legs being weak.     Past Medical History  Diagnosis Date  . Eczema   . Hypothyroidism   . WPW (Wolff-Parkinson-White syndrome)   . Chronic kidney disease   . Lower extremity edema   . Weakness of both legs     knees  . Complication of anesthesia     slow to wake up   . Cardiac arrest (HCC) 1990   . Pneumonia     hx of   . Headache    spinal stenosis   There are no active problems to display for this patient.    Past Surgical History  Procedure Laterality Date  . Abdominal hysterectomy    . Breast enhancement surgery Bilateral   . Wpw N/A   . Cataract extraction Bilateral   . Transurethral resection of bladder tumor N/A 12/28/2014    Procedure: TRANSURETHRAL RESECTION OF BLADDER TUMOR (TURBT);  Surgeon: Vanna Scotland, MD;  Location: [facility] ORS;  Service: Urology;  Laterality: N/A;  . Cystoscopy w/ retrogrades Bilateral 12/28/2014    Procedure: CYSTOSCOPY WITH RETROGRADE PYELOGRAM;  Surgeon: Vanna Scotland, MD;  Location: [facility] ORS;  Service: Urology;  Laterality: Bilateral;  . Heart mapping        Current Outpatient Rx   Name  Route  Sig  Dispense  Refill  . acetaminophen (TYLENOL) 325 MG tablet   Oral   Take 650 mg by mouth every 6 (six) hours as needed.         Marland Kitchen aspirin 325 MG tablet   Oral   Take 325 mg by mouth daily. Only had onceon 02/15/2015         . levothyroxine (SYNTHROID, LEVOTHROID) 200 MCG tablet   Oral   Take 200 mcg by mouth daily before breakfast.         . potassium chloride (K-DUR,KLOR-CON) 10 MEQ tablet   Oral   Take 10 mEq by mouth daily.         Marland Kitchen torsemide (DEMADEX) 20 MG tablet   Oral   Take 20 mg by mouth daily.            Allergies Review of patient's allergies indicates no known allergies.   No family history on file.  Social History Social History  Substance Use Topics  . Smoking status: Never Smoker   . Smokeless tobacco: Never Used  . Alcohol Use: No    Review of Systems  Constitutional:   No fever or chills. No weight changes Eyes:   No blurry vision or double vision.  ENT:   No  sore throat. Cardiovascular:   No chest pain. Respiratory:   No dyspnea or cough. Gastrointestinal:   Negative for abdominal pain, vomiting and diarrhea.  No BRBPR or melena. Genitourinary:   Negative for dysuria, urinary retention, bloody urine, or difficulty urinating. Musculoskeletal:   Negative for back pain. No joint swelling or pain. Skin:   Negative for rash. Neurological:   Negative for headaches, focal weakness or numbness. Psychiatric:  No anxiety or depression.   Endocrine:  No hot/cold intolerance, changes in energy, or sleep difficulty.  10-point ROS otherwise negative.  ____________________________________________   PHYSICAL EXAM:  VITAL SIGNS: ED Triage Vitals  Enc Vitals Group     BP 07/06/15 1111 164/105 mmHg     Pulse Rate 07/06/15 1111 101     Resp 07/06/15 1111 18     Temp 07/06/15 1111 98.5 F (36.9 C)     Temp Source 07/06/15 1111 Oral     SpO2 07/06/15 1111 100 %     Weight 07/06/15 1109 208 lb (94.348 kg)     Height  07/06/15 1109 5' 4.5" (1.638 m)     Head Cir --      Peak Flow --      Pain Score 07/06/15 1109 0     Pain Loc --      Pain Edu? --      Excl. in GC? --      Constitutional:   Alert and oriented to person.. Well appearing and in no distress. Eyes:   No scleral icterus. No conjunctival pallor. PERRL. EOMI ENT   Head:   Normocephalic and atraumatic.   Nose:   No congestion/rhinnorhea. No septal hematoma   Mouth/Throat:   Dry mucous membranes, no pharyngeal erythema. No peritonsillar mass. No uvula shift.   Neck:   No stridor. No SubQ emphysema. No meningismus. Hematological/Lymphatic/Immunilogical:   No cervical lymphadenopathy. Cardiovascular:   RRR. Normal and symmetric distal pulses are present in all extremities. No murmurs, rubs, or gallops. Respiratory:   Normal respiratory effort without tachypnea nor retractions. Breath sounds are clear and equal bilaterally. No wheezes/rales/rhonchi. Gastrointestinal:   Soft with suprapubic tenderness. No distention. There is no CVA tenderness.  No rebound, rigidity, or guarding. Genitourinary:   deferred Musculoskeletal:   Nontender with normal range of motion in all extremities. No joint effusions.  No lower extremity tenderness.  No edema. Neurologic:   Normal speech and language.  CN 2-10 normal. Motor grossly intact. No pronator drift.  Normal gait. No gross focal neurologic deficits are appreciated.  Skin:    Skin is warm, dry and intact. There are stage I decubitus ulcers along the upper thorax and lower back and sacral area.. No rash noted.  No petechiae, purpura, or bullae. Psychiatric:   Mood and affect are normal. Patient is confused and delirious. He is thinking she is at home, keep asking hospital staff where her items at home are. Not oriented. Per friend at the bedside, this is new. ____________________________________________    LABS (pertinent positives/negatives) (all labs ordered are listed, but only abnormal  results are displayed) Labs Reviewed  COMPREHENSIVE METABOLIC PANEL - Abnormal; Notable for the following:    Glucose, Bld 136 (*)    Albumin 3.3 (*)    Total Bilirubin 1.3 (*)    All other components within normal limits  CBC WITH DIFFERENTIAL/PLATELET - Abnormal; Notable for the following:    WBC 13.5 (*)    Neutro Abs 11.7 (*)    Lymphs Abs 0.6 (*)  Monocytes Absolute 1.1 (*)    All other components within normal limits  URINALYSIS COMPLETEWITH MICROSCOPIC ([facility] ONLY) - Abnormal; Notable for the following:    Color, Urine YELLOW (*)    APPearance TURBID (*)    Ketones, ur 1+ (*)    Hgb urine dipstick 1+ (*)    Protein, ur >500 (*)    Leukocytes, UA 2+ (*)    Bacteria, UA MANY (*)    Squamous Epithelial / LPF TOO NUMEROUS TO COUNT (*)    All other components within normal limits  CK - Abnormal; Notable for the following:    Total CK 461 (*)    All other components within normal limits  URINE CULTURE  ETHANOL  LIPASE, BLOOD  TROPONIN I   ____________________________________________   EKG  Interpreted by me Atrial fibrillation rate of 98, left axis, normal intervals. Poor progression in anterior precordial leads, normal ST segments and T waves.  ____________________________________________    RADIOLOGY  Chest x-ray normal, x-ray pelvis unremarkable CT head unremarkable  ____________________________________________   PROCEDURES   ____________________________________________   INITIAL IMPRESSION / ASSESSMENT AND PLAN / ED COURSE  Pertinent labs & imaging results that were available during my care of the patient were reviewed by me and considered in my medical decision making (see chart for details).  Patient presents with early signs of skin breakdown due to lying on the ground, covered in urine which is very foul-smelling. Likely UTI. We'll check labs urinalysis CT head and x-rays.  ----------------------------------------- 3:07 PM on  07/06/2015 -----------------------------------------  Patient found to have urinary tract infection and cystitis. Labs otherwise unremarkable except for very mild elevation in CK. No acute findings on radiology. With the patient's delirium, inability to ambulate or take care of herself, and ongoing tachycardia, I discussed the case with the hospitalist for further management.     ____________________________________________   FINAL CLINICAL IMPRESSION(S) / ED DIAGNOSES  Final diagnoses:  Generalized weakness  Cystitis  Delirium      Sharman CheekPhillip Daesha Insco, MD 07/06/15 [phone]

## 2015-07-06 NOTE — ED Notes (Signed)
Lab called regarding urine culture add on, will add on at this time  

## 2015-07-07 LAB — BASIC METABOLIC PANEL
Anion gap: 10 (ref 5–15)
BUN: 16 mg/dL (ref 6–20)
CHLORIDE: 106 mmol/L (ref 101–111)
CO2: 26 mmol/L (ref 22–32)
Calcium: 9 mg/dL (ref 8.9–10.3)
Creatinine, Ser: 0.72 mg/dL (ref 0.44–1.00)
GFR calc Af Amer: >60 mL/min (ref >60)
GFR calc non Af Amer: >60 mL/min (ref >60)
GLUCOSE: 104 mg/dL — AB (ref 65–99)
POTASSIUM: 2.9 mmol/L — AB (ref 3.5–5.1)
Sodium: 142 mmol/L (ref 135–145)

## 2015-07-07 LAB — T4, FREE: Free T4: 1.67 ng/dL — ABNORMAL HIGH (ref 0.61–1.12)

## 2015-07-07 LAB — POTASSIUM: Potassium: 3.7 mmol/L (ref 3.5–5.1)

## 2015-07-07 LAB — CBC
HEMATOCRIT: 37.2 % (ref 35.0–47.0)
Hemoglobin: 12.7 g/dL (ref 12.0–16.0)
MCH: 31.2 pg (ref 26.0–34.0)
MCHC: 34.2 g/dL (ref 32.0–36.0)
MCV: 91.2 fL (ref 80.0–100.0)
Platelets: 250 10*3/uL (ref 150–440)
RBC: 4.08 MIL/uL (ref 3.80–5.20)
RDW: 13.5 % (ref 11.5–14.5)
WBC: 11.3 10*3/uL — ABNORMAL HIGH (ref 3.6–11.0)

## 2015-07-07 LAB — MAGNESIUM: Magnesium: 1.4 mg/dL — ABNORMAL LOW (ref 1.7–2.4)

## 2015-07-07 LAB — URINE CULTURE

## 2015-07-07 LAB — TSH: TSH: 0.116 u[IU]/mL — AB (ref 0.350–4.500)

## 2015-07-07 MED ORDER — MAGNESIUM SULFATE 4 GM/100ML IV SOLN
4.0000 g | Freq: Once | INTRAVENOUS | Status: AC
Start: 2015-07-07 — End: 2015-07-07
  Administered 2015-07-07: 4 g via INTRAVENOUS
  Filled 2015-07-07: qty 100

## 2015-07-07 MED ORDER — POTASSIUM CHLORIDE 10 MEQ/100ML IV SOLN
10.0000 meq | INTRAVENOUS | Status: DC
  Filled 2015-07-07 (×4): qty 100

## 2015-07-07 MED ORDER — POTASSIUM CHLORIDE CRYS ER 20 MEQ PO TBCR
40.0000 meq | EXTENDED_RELEASE_TABLET | ORAL | Status: DC
  Administered 2015-07-07 (×2): 40 meq via ORAL
  Filled 2015-07-07 (×2): qty 2

## 2015-07-07 MED ORDER — ENSURE ENLIVE PO LIQD
237.0000 mL | Freq: Two times a day (BID) | ORAL | Status: DC
  Administered 2015-07-07 – 2015-07-09 (×5): 237 mL via ORAL

## 2015-07-07 NOTE — Plan of Care (Signed)
Problem: Physical Regulation: Goal: Signs and symptoms of infection will decrease Outcome: Progressing Pt confused. On iv antbx. SR on monitor. No c/o pain nor distress noted. Continue to monitor.

## 2015-07-07 NOTE — Care Management (Signed)
Admitted to [facility] with the diagnosis of sepsis. Lives alone. Contact person is Marie Mullen [phone]). Primary care physician is listed as Marie Mullen. Diagnosed with a bladder mass 12/30/2014. Documentation in the charge indicates multiple falls in the home and not remembering to eat. Marie Mullen is sleeping. Will continue assessment at a later time.  Physical therapy evaluation pending.  Marie GreetBrenda S Hadja Harral RN MSN CCM Care Management [phone]

## 2015-07-07 NOTE — Progress Notes (Signed)
[facility]   PATIENT NAME: Marie Mullen    MR#:  [id number]  DATE OF BIRTH:  1941-03-26  SUBJECTIVE:  CHIEF COMPLAINT:   Chief Complaint  Patient presents with  . Weakness   patient is 74 year old Caucasian female with past medical history significant for history of sick ED, left extremity edema, weakness, hypothyroidism and recent bladder mass status post biopsy who presents to the hospital with generalized weakness, fall, inability to get up after fall. She was noted to be confused . Initial patient's labs were remarkable for hyperglycemia and elevated white blood cell count. Urinalysis was remarkable for pyuria and bacteriuria. Patient's urine cultures were taken and antibiotic was started. Unfortunately, urine culture was found to be contaminated. Patient feels a little bit better today, but not able to provide review of systems. Admits of back pains, mostly on the left side  Review of Systems  Unable to perform ROS: medical condition    VITAL SIGNS: Blood pressure 116/59, pulse 103, temperature 99.2 F (37.3 C), temperature source Oral, resp. rate 18, height 5' 4.5" (1.638 m), weight 95.074 kg (209 lb 9.6 oz), SpO2 98 %.  PHYSICAL EXAMINATION:   GENERAL:  74 y.o.-year-old patient lying in the bed with no acute distress. Weak somnolent, but able to open her eyes and converse some,  intermittently follows commands,  EYES: Pupils equal, round, reactive to light and accommodation. No scleral icterus. Extraocular muscles intact.  HEENT: Head atraumatic, normocephalic. Oropharynx and nasopharynx clear.  NECK:  Supple, no jugular venous distention. No thyroid enlargement, no tenderness.  LUNGS: Normal breath sounds bilaterally, no wheezing, rales,rhonchi or crepitation. No use of accessory muscles of respiration.  CARDIOVASCULAR: S1, S2 normal. No murmurs, rubs, or gallops.  ABDOMEN: Soft, moderately tender in left upper quadrant and  epigastric area, rebound or guarding, nondistended. Bowel sounds present. No organomegaly or mass.  EXTREMITIES: No pedal edema, cyanosis, or clubbing. Minimal discomfort on left CVA percussion NEUROLOGIC: Cranial nerves II through XII are intact. Muscle strength 5/5 in all extremities. Sensation intact. Gait not checked.  PSYCHIATRIC: The patient is alert and oriented x 3.  SKIN: No obvious rash, lesion, or ulcer.   ORDERS/RESULTS REVIEWED:   CBC  Recent Labs Lab 07/06/15 1118 07/07/15 0620  WBC 13.5* 11.3*  HGB 13.8 12.7  HCT 41.6 37.2  PLT 267 250  MCV 92.5 91.2  MCH 30.6 31.2  MCHC 33.1 34.2  RDW 13.6 13.5  LYMPHSABS 0.6*  --   MONOABS 1.1*  --   EOSABS 0.0  --   BASOSABS 0.0  --    ------------------------------------------------------------------------------------------------------------------  Chemistries   Recent Labs Lab 07/06/15 1118 07/07/15 0620  NA 137 142  K 4.1 2.9*  CL 102 106  CO2 22 26  GLUCOSE 136* 104*  BUN 19 16  CREATININE 0.66 0.72  CALCIUM 9.5 9.0  MG  --  1.4*  AST 29  --   ALT 15  --   ALKPHOS 105  --   BILITOT 1.3*  --    ------------------------------------------------------------------------------------------------------------------ estimated creatinine clearance is 67.6 mL/min (by C-G formula based on Cr of 0.72). ------------------------------------------------------------------------------------------------------------------  Recent Labs  07/07/15 0620  TSH 0.116*    Cardiac Enzymes  Recent Labs Lab 07/06/15 1118  TROPONINI <0.03   ------------------------------------------------------------------------------------------------------------------ Invalid input(s): POCBNP ---------------------------------------------------------------------------------------------------------------  RADIOLOGY: Dg Chest 2 View  07/06/2015  CLINICAL DATA:  Patient was found down at home.  Weakness. EXAM: CHEST  2 VIEW COMPARISON:   09/20/2014  FINDINGS: Heart size and pulmonary vascularity are normal and the lungs are clear. Prominent left pericardial fat pad. Calcified breast implants. Diffuse osteopenia with accentuation of the thoracic kyphosis. No acute osseous abnormality. No effusions. IMPRESSION: No acute abnormalities. Electronically Signed   By: Francene BoyersJames  Maxwell M.D.   On: 07/06/2015 12:33   Dg Pelvis 1-2 Views  07/06/2015  CLINICAL DATA:  Generalized weakness.  Found on floor. EXAM: PELVIS - 1-2 VIEW COMPARISON:  None. FINDINGS: Single AP view of the pelvis demonstrates osteopenia. Femoral heads are located. No acute fracture. Sacroiliac joints are symmetric. IMPRESSION: No acute osseous abnormality. Electronically Signed   By: Jeronimo GreavesKyle  Talbot M.D.   On: 07/06/2015 12:30   Ct Head Wo Contrast  07/06/2015  CLINICAL DATA:  Increasing weakness with loss of consciousness for 2 days. Found down. Initial encounter. EXAM: CT HEAD WITHOUT CONTRAST TECHNIQUE: Contiguous axial images were obtained from the base of the skull through the vertex without intravenous contrast. COMPARISON:  None. FINDINGS: There is no evidence of acute intracranial hemorrhage, mass lesion, brain edema or extra-axial fluid collection. The ventricles and subarachnoid spaces are mildly prominent for age. There is no CT evidence of acute cortical infarction. There is mild periventricular low-density, likely due to chronic small vessel ischemic change. There is a possible chronic infarct or prominent perivascular space in the right thalamus. Intracranial vascular calcifications are noted. The visualized paranasal sinuses, mastoid air cells and middle ears are clear. The calvarium is demineralized without evidence of acute fracture. IMPRESSION: No acute intracranial findings identified. Atrophy and probable mild chronic small vessel ischemic changes. Electronically Signed   By: Carey BullocksWilliam  Veazey M.D.   On: 07/06/2015 13:17    EKG:  Orders placed or performed during  the hospital encounter of 07/06/15  . ED EKG  . ED EKG    ASSESSMENT AND PLAN:  Active Problems:   Sepsis (HCC) 1. Sepsis due to acute pyelonephritis , continue Rocephin, follow culture results  2. Acute pyelonephritis , complicated , continue Rocephin. Repeat urinary cultures  3. Leukocytosis , improving with antibiotic therapy  4. Hypokalemia , supplement orally , replenish magnesium level due to hypomagnesemia  5. Generalized weakness   with physical therapist involved for further recommendations. Likely rehabilitation placement 6. Metabolic encephalopathy, seems to be improving, following clinically    Management plans discussed with the patient, family and they are in agreement.   DRUG ALLERGIES: No Known Allergies  CODE STATUS:     Code Status Orders        Start     Ordered   07/06/15 1733  Full code   Continuous     07/06/15 1732      TOTAL TIME TAKING CARE OF THIS PATIENT: 40tes.    Katharina CaperVAICKUTE,Archer Vise M.D on 07/07/2015 at 1:08 PM  Between 7am to 6pm - Pager - [phone]  After 6pm go to [email or web address] - password [email or web address]  [facility]  Office  [phone]  CC: Primary care physician; Lauro RegulusANDERSON,MARSHALL W., MD

## 2015-07-07 NOTE — Clinical Social Work Note (Signed)
Clinical Social Worker received consult for pt as she had a fall at home. [facility] [place] [facility]. Gwen Williams-Jefferies [phone]) is the case worker. PT consult is pending. Full assessment to follow.   Dede QuerySarah Douglas Smolinsky, MSW, LCSW Clinical Social Worker  [phone]

## 2015-07-07 NOTE — Evaluation (Signed)
Physical Therapy Evaluation Patient Details Name: Marie Mullen MRN: [id number] DOB: 1940-10-31 Today's Date: 07/07/2015   History of Present Illness  Pt is a 74 yo female who was admitted to the hospital after a fall and reports of generalized LE weakness. Once at hospital pt was found to have UTI  Clinical Impression  Pt presents with hx of CKD, pneumonia, headache, hypothyroidism, and cardiac arrest. Examination reveals that pt performs bed mobility at mod A and transfers at max A. Ambulation is not safe or appropriate secondary to weakness. Pt's cognition makes her history questionable, although she is A&O x 3. Pt has generalized weakness that is likely contributing to her decreased functional mobility, and she will continue to benefit from skilled PT in order to address these deficits. Pt currently far removed from functional baseline. Pt able to participate in therapy tasks and is very pleasant.     Follow Up Recommendations SNF    Equipment Recommendations  Rolling walker with 5" wheels    Recommendations for Other Services       Precautions / Restrictions Precautions Precautions: Fall Restrictions Weight Bearing Restrictions: No      Mobility  Bed Mobility Overal bed mobility: Needs Assistance Bed Mobility: Supine to Sit     Supine to sit: Mod assist     General bed mobility comments: Pt needs fair assist to get into sitting, especially with LE management and minor assist for trunk  Transfers Overall transfer level: Needs assistance Equipment used: Rolling walker (2 wheeled) Transfers: Sit to/from Stand Sit to Stand: Max assist         General transfer comment: Pt needs max A from therapist to get into standing. She is not able to get fully into standing due to weakness. Pt attempted two unsuccessful sit-to-stands before needing to use the bathroom  Ambulation/Gait Ambulation/Gait assistance:  (Not appropriate/safe secondary to weakness)               Stairs            Wheelchair Mobility    Modified Rankin (Stroke Patients Only)       Balance Overall balance assessment: History of Falls                                           Pertinent Vitals/Pain Pain Assessment: No/denies pain    Home Living Family/patient expects to be discharged to:: Unsure (unreliable hx; pt states she lives alone) Living Arrangements: Alone Available Help at Discharge:  (unsure) Type of Home:  (Pt unable to recall)           Additional Comments: Need more reliable history    Prior Function Level of Independence: Independent with assistive device(s)         Comments: Pt was reportedly independent with standard walker with ambulation     Hand Dominance        Extremity/Trunk Assessment   Upper Extremity Assessment: Generalized weakness           Lower Extremity Assessment: Generalized weakness         Communication   Communication: No difficulties  Cognition Arousal/Alertness: Awake/alert Behavior During Therapy: Anxious Overall Cognitive Status: No family/caregiver present to determine baseline cognitive functioning                      General Comments  Exercises Other Exercises Other Exercises: Pt was able to participate and perform bilateral therex x 10 reps at min A for proper technique. Exercises performed: ankle pumps, SAQ, hip abd, and LAQ.  Other Exercises: Pt was assisted with toileting on bed pan and instructed on how to properly perform rolling.       Assessment/Plan    PT Assessment Patient needs continued PT services  PT Diagnosis Difficulty walking;Generalized weakness   PT Problem List Decreased strength;Decreased activity tolerance;Decreased balance;Decreased cognition  PT Treatment Interventions DME instruction;Stair training;Gait training;Functional mobility training;Therapeutic activities;Therapeutic exercise;Balance training;Neuromuscular re-education    PT Goals (Current goals can be found in the Care Plan section) Acute Rehab PT Goals Patient Stated Goal: none stated PT Goal Formulation: With patient Time For Goal Achievement: 07/21/15 Potential to Achieve Goals: Fair    Frequency Min 2X/week   Barriers to discharge        Co-evaluation               End of Session Equipment Utilized During Treatment: Gait belt Activity Tolerance: Patient tolerated treatment well Patient left: in bed;with call bell/phone within reach;with bed alarm set;with nursing/sitter in room Nurse Communication: Mobility status         Time: 1000-1025 PT Time Calculation (min) (ACUTE ONLY): 25 min   Charges:         PT G CodesBenna Dunks:        Da Authement 07/07/2015, 1:31 PM  Benna Dunksasey Alazay Leicht, SPT. [phone]

## 2015-07-07 NOTE — Care Management Important Message (Signed)
Important Message  Patient Details  Name: Marie Mullen MRN: [id number] Date of Birth: Dec 31, 1940   Medicare Important Message Given:  Yes    Gwenette GreetBrenda S Mindee Robledo, RN 07/07/2015, 8:24 AM

## 2015-07-07 NOTE — Clinical Documentation Improvement (Signed)
Internal Medicine  Can the diagnosis of CKD be further specified? Please update your documentation within the medical record to reflect your response to this query. Do not document in BPA drop down box; add information in progress note. Thank you   CKD Stage I - GFR greater than or equal to 90  CKD Stage II - GFR 60-89  CKD Stage III - GFR 30-59  CKD Stage IV - GFR 15-29  CKD Stage V - GFR < 15  ESRD (End Stage Renal Disease)  Other condition  Unable to clinically determine  Supporting Information: : (risk factors, signs and symptoms, diagnostics, treatment)  White female  GFR in February 2016 was 47; current GFR's running greater than 60.  Please exercise your independent, professional judgment when responding. A specific answer is not anticipated or expected.  Thank You, Shellee MiloEileen T Chan Rosasco RN, BSN Health Information Management [place] [phone]: Cell: [phone]

## 2015-07-07 NOTE — Progress Notes (Signed)
Initial Nutrition Assessment   INTERVENTION:   Meals and Snacks: Cater to patient preferences. Recommend liberalizing diet order to 2g Na as pt not on dialysis with potassium currently low and sodium WDL, as current renal diet order currently restricts potassium and phosphorus containing foods. Medical Food Supplement Therapy: will recommend Ensure Enlive po BID, each supplement provides 350 kcal and 20 grams of protein   NUTRITION DIAGNOSIS:   Inadequate oral intake related to acute illness as evidenced by meal completion < 25%.  GOAL:   Patient will meet greater than or equal to 90% of their needs   MONITOR:    (Energy Intake, Anthropometrics, Electrolyte and renal Profile)  REASON FOR ASSESSMENT:    (Diet Order)    ASSESSMENT:   Pt admitted s/p fall with AMS secondary to cytitis and sepsis. Per MD note, pt with h/o CKD with recent diagnosis of bladder mass.  Past Medical History  Diagnosis Date  . Eczema   . Hypothyroidism   . WPW (Wolff-Parkinson-White syndrome)   . Chronic kidney disease   . Lower extremity edema   . Weakness of both legs     knees  . Complication of anesthesia     slow to wake up   . Cardiac arrest (HCC) 1990   . Pneumonia     hx of   . Headache     Diet Order:  Diet 2 gram sodium Room service appropriate?: Yes; Fluid consistency:: Thin    Current Nutrition: Pt ate bites of breakfast this am reports poor appetite.   Food/Nutrition-Related History: Difficult to clarify with pt on visit this am but pt did report not wanting to eat breakfast this am as she usually does not eat breakfast. Pt did report usually eating lunch and dinner.  Pt reports never having Ensure before.    Scheduled Medications:  . cefTRIAXone (ROCEPHIN)  IV  1 g Intravenous Q24H  . docusate sodium  100 mg Oral BID  . enoxaparin (LOVENOX) injection  40 mg Subcutaneous Q24H  . feeding supplement (ENSURE ENLIVE)  237 mL Oral BID BM  . magnesium sulfate 1 - 4 g bolus  IVPB  4 g Intravenous Once  . potassium chloride  40 mEq Oral Q4H  . sodium chloride  3 mL Intravenous Q12H     Electrolyte/Renal Profile and Glucose Profile:   Recent Labs Lab 07/06/15 1118 07/07/15 0620  NA 137 142  K 4.1 2.9*  CL 102 106  CO2 22 26  BUN 19 16  CREATININE 0.66 0.72  CALCIUM 9.5 9.0  MG  --  1.4*  GLUCOSE 136* 104*   Protein Profile:  Recent Labs Lab 07/06/15 1118  ALBUMIN 3.3*    Gastrointestinal Profile: Last BM:  07/05/2015   Nutrition-Focused Physical Exam Findings: Nutrition-Focused physical exam completed. Findings are WDL for fat depletion, muscle depletion, and edema.    Weight Change: Pt reports UBW of 208lbs. Per CHL weight of 196lbs in May.   Skin:  Reviewed, no issues   Height:   Ht Readings from Last 1 Encounters:  07/06/15 5' 4.5" (1.638 m)    Weight:   Wt Readings from Last 1 Encounters:  07/07/15 209 lb 9.6 oz (95.074 kg)     BMI:  Body mass index is 35.44 kg/(m^2).   EDUCATION NEEDS:   No education needs identified at this time   LOW Care Level  Leda QuailAllyson Marlie Kuennen, IowaRD, LDN Pager [phone]

## 2015-07-07 NOTE — Plan of Care (Signed)
Problem: Education: Goal: Knowledge of [facility] Education information/materials will improve Outcome: Progressing Educated pt about high fall risk precautions, pain numerical scale pt verbalized understanding. Plan of care reviewed with pt's sister and pt.   Problem: Physical Regulation: Goal: Diagnostic test results will improve Outcome: Progressing Potassium serum 2.9, potassium PO ordered and given. Magnesium serum 1.4, Magnesium IV ordered and given. Urine culture recollected, results pending. Goal: Signs and symptoms of infection will decrease Outcome: Progressing Afebrile. VSS. Denies pain. Pt alert to self, place and situation with increased confusion at times. Rocephin qx24hrs continues. WBC's 11.3.

## 2015-07-08 DIAGNOSIS — E039 Hypothyroidism, unspecified: Secondary | ICD-10-CM

## 2015-07-08 DIAGNOSIS — F0391 Unspecified dementia with behavioral disturbance: Secondary | ICD-10-CM

## 2015-07-08 DIAGNOSIS — D72829 Elevated white blood cell count, unspecified: Secondary | ICD-10-CM

## 2015-07-08 DIAGNOSIS — E876 Hypokalemia: Secondary | ICD-10-CM

## 2015-07-08 DIAGNOSIS — N182 Chronic kidney disease, stage 2 (mild): Secondary | ICD-10-CM

## 2015-07-08 DIAGNOSIS — R531 Weakness: Secondary | ICD-10-CM

## 2015-07-08 DIAGNOSIS — N3289 Other specified disorders of bladder: Secondary | ICD-10-CM

## 2015-07-08 DIAGNOSIS — N10 Acute tubulo-interstitial nephritis: Secondary | ICD-10-CM

## 2015-07-08 DIAGNOSIS — G9341 Metabolic encephalopathy: Secondary | ICD-10-CM

## 2015-07-08 MED ORDER — LEVOTHYROXINE SODIUM 100 MCG PO TABS: 100.0000 ug | ORAL_TABLET | Freq: Every day | ORAL | Status: DC

## 2015-07-08 MED ORDER — POTASSIUM CHLORIDE CRYS ER 10 MEQ PO TBCR: 10.0000 meq | EXTENDED_RELEASE_TABLET | Freq: Every day | ORAL | Status: DC | PRN

## 2015-07-08 MED ORDER — CEPHALEXIN 500 MG PO CAPS: 500.0000 mg | ORAL_CAPSULE | Freq: Two times a day (BID) | ORAL | Status: DC

## 2015-07-08 MED ORDER — TORSEMIDE 20 MG PO TABS: 40.0000 mg | ORAL_TABLET | Freq: Every day | ORAL | Status: DC | PRN

## 2015-07-08 MED ORDER — RISPERIDONE 0.5 MG PO TBDP: 0.5000 mg | ORAL_TABLET | Freq: Four times a day (QID) | ORAL | Status: DC | PRN

## 2015-07-08 NOTE — Plan of Care (Signed)
Problem: Education: Goal: Knowledge of [facility] Education information/materials will improve Outcome: Progressing Explained  Pain protocol to pt again. Pt denies pain.  Cont  With confusion  Problem: Physical Regulation: Goal: Signs and symptoms of infection will decrease Outcome: Progressing Pt to be discharged to a snf at discharge  Due to not being able to  Care for self at home. abx cont.  Afebrile. Order for psych consult. Dr clapacs to see.

## 2015-07-08 NOTE — Care Management (Signed)
Plan to discharge to SNF after psych consult.  CSW following

## 2015-07-08 NOTE — Plan of Care (Addendum)
C/o headache relieved by PRN Tylenol. Resting comfortably in bed through the night. High fall risk. Bed alarm on, hourly rounding. PT recommending SNF- placement pending.   Problem: Physical Regulation: Goal: Signs and symptoms of infection will decrease Outcome: Progressing IV abx given as scheduled. IV Magnesium given as ordered. Afebrile. VSS.

## 2015-07-08 NOTE — Consult Note (Signed)
[facility] Face-to-Face Psychiatry Consult   Reason for Consult:  Consult for this 74 year old woman currently in the hospital with sepsis and multiple other medical problems. Consult for agitation Referring Physician:  Ether Griffins Patient Identification: Marie Mullen MRN:  [id number] Principal Diagnosis: Sepsis [facility]) Diagnosis:   Patient Active Problem List   Diagnosis Date Noted  . 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 Acute pyelonephritis [N10] 07/08/2015  . Leukocytosis [D72.829] 07/08/2015  . Hypokalemia [E87.6] 07/08/2015  . Generalized weakness [R53.1] 07/08/2015  . Encephalopathy, metabolic [F64.33] 29/51/8841  . Hypothyroidism [E03.9] 07/08/2015  . Bladder mass [N32.89] 07/08/2015  . Sepsis ([place]) [A41.9] 07/06/2015    Total Time spent with patient: 1 hour  Subjective:   Marie Mullen is a 74 y.o. female patient admitted with "they think that I fell but I did not.  HPI:  Information from the patient and the chart. Patient interviewed. Chart reviewed. I also spoke with the patient's sister who is her emergency contact who gave me quite a bit of history. Patient is currently in the hospital after being found on the floor at her home and is being treated for pyelonephritis possible sepsis multiple other medical problems. According to the nursing staff the patient is intermittently agitated. Her agitation does not take the form of action being physically aggressive but she will become verbally nasty and angry and argumentative easily. The patient herself denies any mood problems. Denies feeling depressed or angry. Says that she is been sleeping okay. She is not aware of having any mental health complaints. She denies any auditory or visual hallucinations. Denies any psychiatric medication. Denies alcohol or drug abuse. The sister states that for quite a while probably as much as a year the patient's mental status has been declining. She has been getting  increasingly confused at times. Her mood is become more labile. She will get angry and lose her temper and become paranoid. She has started a pattern of stating that she is going to sue somebody every time anything starts to get on her nerves. Family believes that the patient is getting more demented and is trying to get guardianship if possible.  Social history: Patient is unmarried and lives alone. 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 Family describe her as being very intelligent and professional when she was in her prime.  Medical history: Patient has kidney disease pyelonephritis hypothyroidism. 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 When a follow-up urology physician wanted to have it read biopsied and pursue treatment the patient basically refused to carry on with that.  Substance abuse history: No history of drug or alcohol abuse  Past Psychiatric History: No history of psychiatric illness. No history of mental health problem. Never been hospitalized no history of suicide attempts. Patient is quite proud about this  Risk to Self: Is patient at risk for suicide?: No Risk to Others:   Prior Inpatient Therapy:   Prior Outpatient Therapy:    Past Medical History:  Past Medical History  Diagnosis Date  . Eczema   . Hypothyroidism   . WPW (Wolff-Parkinson-White syndrome)   . Chronic kidney disease   . Lower extremity edema   . Weakness of both legs     knees  . Complication of anesthesia     slow to wake up   . Cardiac arrest ([place]) 1990   . Pneumonia  hx of   . Headache     Past Surgical History  Procedure Laterality Date  . Abdominal hysterectomy    . Breast enhancement surgery Bilateral   . Wpw N/A   . Cataract extraction Bilateral   . Transurethral resection of bladder tumor N/A 12/28/2014    Procedure: TRANSURETHRAL RESECTION OF BLADDER TUMOR (TURBT);  Surgeon:  Hollice Espy, MD;  Location: [facility] ORS;  Service: Urology;  Laterality: N/A;  . Cystoscopy w/ retrogrades Bilateral 12/28/2014    Procedure: CYSTOSCOPY WITH RETROGRADE PYELOGRAM;  Surgeon: Hollice Espy, MD;  Location: [facility] ORS;  Service: Urology;  Laterality: Bilateral;  . Heart mapping      Family History: History reviewed. No pertinent family history. Family Psychiatric  History: Family denies to being any mental health history or substance abuse history in the family Social History:  History  Alcohol Use No     History  Drug Use No    Social History   Social History  . Marital Status: Single    Spouse Name: N/A  . Number of Children: N/A  . Years of Education: N/A   Social History Main Topics  . Smoking status: Never Smoker   . Smokeless tobacco: Never Used  . Alcohol Use: No  . Drug Use: No  . Sexual Activity: Not Asked   Other Topics Concern  . None   Social History Narrative   Additional Social History:                          Allergies:  No Known Allergies  Labs:  Results for orders placed or performed during the hospital encounter of 07/06/15 (from the past 48 hour(s))  TSH     Status: Abnormal   Collection Time: 07/07/15  6:20 AM  Result Value Ref Range   TSH 0.116 (L) 0.350 - 4.500 uIU/mL  Basic metabolic panel     Status: Abnormal   Collection Time: 07/07/15  6:20 AM  Result Value Ref Range   Sodium 142 135 - 145 mmol/L   Potassium 2.9 (LL) 3.5 - 5.1 mmol/L    Comment: CRITICAL RESULT CALLED TO, READ BACK BY AND VERIFIED WITH MALKA SINWANY ON 07/07/2015 AT 0839    Chloride 106 101 - 111 mmol/L   CO2 26 22 - 32 mmol/L   Glucose, Bld 104 (H) 65 - 99 mg/dL   BUN 16 6 - 20 mg/dL   Creatinine, Ser 0.72 0.44 - 1.00 mg/dL   Calcium 9.0 8.9 - 10.3 mg/dL   GFR calc non Af Amer >60 >60 mL/min   GFR calc Af Amer >60 >60 mL/min    Comment: (NOTE) The eGFR has been calculated using the CKD EPI equation. This calculation has not been validated  in all clinical situations. eGFR's persistently <60 mL/min signify possible Chronic Kidney Disease.    Anion gap 10 5 - 15  CBC     Status: Abnormal   Collection Time: 07/07/15  6:20 AM  Result Value Ref Range   WBC 11.3 (H) 3.6 - 11.0 K/uL   RBC 4.08 3.80 - 5.20 MIL/uL   Hemoglobin 12.7 12.0 - 16.0 g/dL   HCT 37.2 35.0 - 47.0 %   MCV 91.2 80.0 - 100.0 fL   MCH 31.2 26.0 - 34.0 pg   MCHC 34.2 32.0 - 36.0 g/dL   RDW 13.5 11.5 - 14.5 %   Platelets 250 150 - 440 K/uL  T4, free  Status: Abnormal   Collection Time: 07/07/15  6:20 AM  Result Value Ref Range   Free T4 1.67 (H) 0.61 - 1.12 ng/dL  Magnesium     Status: Abnormal   Collection Time: 07/07/15  6:20 AM  Result Value Ref Range   Magnesium 1.4 (L) 1.7 - 2.4 mg/dL  Urine culture     Status: None (Preliminary result)   Collection Time: 07/07/15  2:39 PM  Result Value Ref Range   Specimen Description URINE, CLEAN CATCH    Special Requests Normal    Culture INSIGNIFICANT GROWTH    Report Status PENDING   Potassium     Status: None   Collection Time: 07/07/15  3:16 PM  Result Value Ref Range   Potassium 3.7 3.5 - 5.1 mmol/L    Current Facility-Administered Medications  Medication Dose Route Frequency Provider Last Rate Last Dose  . acetaminophen (TYLENOL) tablet 650 mg  650 mg Oral Q6H PRN Nicholes Mango, MD   650 mg at 07/08/15 0022   Or  . acetaminophen (TYLENOL) suppository 650 mg  650 mg Rectal Q6H PRN Nicholes Mango, MD      . cefTRIAXone (ROCEPHIN) 1 g in dextrose 5 % 50 mL IVPB  1 g Intravenous Q24H Nicholes Mango, MD   1 g at 07/07/15 1828  . docusate sodium (COLACE) capsule 100 mg  100 mg Oral BID Nicholes Mango, MD   100 mg at 07/08/15 0918  . enoxaparin (LOVENOX) injection 40 mg  40 mg Subcutaneous Q24H Nicholes Mango, MD   40 mg at 07/07/15 2305  . feeding supplement (ENSURE ENLIVE) (ENSURE ENLIVE) liquid 237 mL  237 mL Oral BID BM Theodoro Grist, MD   237 mL at 07/08/15 1349  . ondansetron (ZOFRAN) tablet 4 mg  4 mg Oral  Q6H PRN Nicholes Mango, MD       Or  . ondansetron (ZOFRAN) injection 4 mg  4 mg Intravenous Q6H PRN Aruna Gouru, MD      . risperiDONE (RISPERDAL M-TABS) disintegrating tablet 0.5 mg  0.5 mg Oral Q6H PRN Gonzella Lex, MD      . sodium chloride 0.9 % injection 3 mL  3 mL Intravenous Q12H Nicholes Mango, MD   3 mL at 07/08/15 0921    Musculoskeletal: Strength & Muscle Tone: decreased Gait & Station: unsteady Patient leans: N/A  Psychiatric Specialty Exam: Review of Systems  Constitutional: Negative.   HENT: Negative.   Eyes: Negative.   Respiratory: Negative.   Cardiovascular: Negative.   Gastrointestinal: Negative.   Musculoskeletal: Negative.   Skin: Negative.   Neurological: Negative.   Psychiatric/Behavioral: Negative for depression, suicidal ideas, hallucinations, memory loss and substance abuse. The patient is not nervous/anxious and does not have insomnia.     Blood pressure 135/74, pulse 94, temperature 97.5 F (36.4 C), temperature source Oral, resp. rate 20, height 5' 4.5" (1.638 m), weight 94.802 kg (209 lb), SpO2 98 %.Body mass index is 35.33 kg/(m^2).  General Appearance: Disheveled  Eye Sport and exercise psychologist::  Fair  Speech:  Normal Rate  Volume:  Normal  Mood:  Anxious  Affect:  A little irritable  Thought Process:  Circumstantial and Tangential  Orientation:  Full (Time, Place, and Person)  Thought Content:  Paranoid Ideation  Suicidal Thoughts:  No  Homicidal Thoughts:  No  Memory:  Immediate;   Good Recent;   Poor Remote;   Fair  Judgement:  Impaired  Insight:  Lacking  Psychomotor Activity:  Psychomotor Retardation  Concentration:  Fair  Recall:  Smiley Houseman of Knowledge:Fair  Language: Fair  Akathisia:  No  Handed:  Right  AIMS (if indicated):     Assets:  Desire for Improvement Financial Resources/Insurance Housing Resilience Social Support  ADL's:  Intact  Cognition: Impaired,  Mild  Sleep:      Treatment Plan Summary: Medication management and Plan This  is a 74 year old woman who has some memory problems and some confusion. She is far from extremely demented but she is clearly very proud of her independence. She insists that she did not fall down at home as her family says but record that she had simply stayed on the ground because she realized she could not sit up. She has a lot of excuses for her inability to function. She clearly is resentful of any attempts to characterize her as having problems or being incapable. This probably results in a lot of her defensiveness and irritability. I don't think she is frankly psychotic and it's impossible to make a diagnosis of depression. I have ordered low doses of Risperdal 0.5 mg as needed for agitation. Case discussed with nursing. Will follow-up after the weekend if necessary.  Disposition: Patient does not meet criteria for psychiatric inpatient admission. Supportive therapy provided about ongoing stressors.  John Clapacs 07/08/2015 6:58 PM

## 2015-07-08 NOTE — Clinical Social Work Note (Signed)
Clinical Social Work Assessment  Patient Details  Name: Marie Mullen MRN: [id number] Date of Birth: [birth year]  Date of referral:  07/08/15               Reason for consult:  Facility Placement                Permission sought to share information with:  Family Supports Permission granted to share information::  Yes, Verbal Permission Granted  Name::      (sister)  Chief Strategy Officer::   (Department of Social Services, APS)   Housing/Transportation Living arrangements for the past 2 months:  Single Family Home Source of Information:  Patient, Other (Comment Required) (Sister) Patient Interpreter Needed:  None Criminal Activity/Legal Involvement Pertinent to Current Situation/Hospitalization:  No - Comment as needed Significant Relationships:  Siblings Lives with:  Self Do you feel safe going back to the place where you live?  No Need for family participation in patient care:  Yes (Comment)  Care giving concerns:  Pt lives alone and is unable to care for herself.    Social Worker assessment / plan:  CSW met with pt to address consult. CSW introduced herself and explained role of social work. CSW also explained the process of discharging to SNF. Pt appeared to be agreeable to SNF placement however it is questionable if she understands what she is agreeing to. Pt's sister is in agreement for SNF as pt is unable to care for herself at home. CSW requested Psych consult. CSW updated DSS worker.   Employment status:  Retired Forensic scientist:  Managed Care PT Recommendations:  [place] / Referral to community resources:  APS (Comment Required: [place], Name & Number of worker spoken with) Meredith Mody Williams-Jefferies [phone]))  Patient/Family's Response to care:  Pt's sister was appreciative of CSW support.   Patient/Family's Understanding of and Emotional Response to Diagnosis, Current Treatment, and Prognosis:  Pt's sister is agreeable to SNF.   Emotional  Assessment Appearance:  Appears older than stated age Attitude/Demeanor/Rapport:  Angry Affect (typically observed):  Agitated Orientation:  Oriented to Self, Fluctuating Orientation (Suspected and/or reported Sundowners) Alcohol / Substance use:  Never Used Psych involvement (Current and /or in the community):  Yes (Comment) (consulted for capacity)  Discharge Needs  Concerns to be addressed:  Compliance Issues Concerns, Adjustment to Illness, Decision making concerns Readmission within the last 30 days:  No Current discharge risk:  Lives alone, Cognitively Impaired Barriers to Discharge:  Other (addressing concerns)   Darden Dates, LCSW 07/08/2015, 5:20 PM

## 2015-07-08 NOTE — Progress Notes (Signed)
[facility] - [place] at [facility]   PATIENT NAME: Marie Mullen    MR#:  [id number]  DATE OF BIRTH:  1941/02/12  SUBJECTIVE:  CHIEF COMPLAINT:   Chief Complaint  Patient presents with  . Weakness   patient is 74 year old Caucasian female with past medical history significant for history of sick ED, left extremity edema, weakness, hypothyroidism and recent bladder mass status post biopsy who presents to the hospital with generalized weakness, fall, inability to get up after fall. She was noted to be confused . Initial patient's labs were remarkable for hyperglycemia and elevated white blood cell count. Urinalysis was remarkable for pyuria and bacteriuria. Patient's urine cultures were taken and antibiotic was started. Patient is agitated today, not able to review systems. Apparently DSS is involved due to patient's self neglect. Urine cultures revealed insignificant growth, although full culture report is pending Review of Systems  Unable to perform ROS: medical condition    VITAL SIGNS: Blood pressure 133/65, pulse 88, temperature 98.4 F (36.9 C), temperature source Oral, resp. rate 20, height 5' 4.5" (1.638 m), weight 94.802 kg (209 lb), SpO2 95 %.  PHYSICAL EXAMINATION:   GENERAL:  74 y.o.-year-old patient lying in the bed in moderate to severe distress,. Very angry and upset about breakfast, confused and agitated  EYES: Pupils equal, round, reactive to light and accommodation. No scleral icterus. Extraocular muscles intact.  HEENT: Head atraumatic, normocephalic. Oropharynx and nasopharynx clear.  NECK:  Supple, no jugular venous distention. No thyroid enlargement, no tenderness.  LUNGS: Normal breath sounds bilaterally, no wheezing, rales,rhonchi or crepitation. No use of accessory muscles of respiration.  CARDIOVASCULAR: S1, S2 normal. No murmurs, rubs, or gallops.  ABDOMEN: Soft, moderately tender in left upper quadrant and epigastric area, rebound or  guarding, nondistended. Bowel sounds present. No organomegaly or mass.  EXTREMITIES: No pedal edema, cyanosis, or clubbing. Minimal discomfort on left CVA percussion NEUROLOGIC: Cranial nerves II through XII are intact. Muscle strength 5/5 in all extremities. Sensation intact. Gait not checked.  PSYCHIATRIC: The patient is alert and oriented x 3.  SKIN: No obvious rash, lesion, or ulcer.   ORDERS/RESULTS REVIEWED:   CBC  Recent Labs Lab 07/06/15 1118 07/07/15 0620  WBC 13.5* 11.3*  HGB 13.8 12.7  HCT 41.6 37.2  PLT 267 250  MCV 92.5 91.2  MCH 30.6 31.2  MCHC 33.1 34.2  RDW 13.6 13.5  LYMPHSABS 0.6*  --   MONOABS 1.1*  --   EOSABS 0.0  --   BASOSABS 0.0  --    ------------------------------------------------------------------------------------------------------------------  Chemistries   Recent Labs Lab 07/06/15 1118 07/07/15 0620 07/07/15 1516  NA 137 142  --   K 4.1 2.9* 3.7  CL 102 106  --   CO2 22 26  --   GLUCOSE 136* 104*  --   BUN 19 16  --   CREATININE 0.66 0.72  --   CALCIUM 9.5 9.0  --   MG  --  1.4*  --   AST 29  --   --   ALT 15  --   --   ALKPHOS 105  --   --   BILITOT 1.3*  --   --    ------------------------------------------------------------------------------------------------------------------ estimated creatinine clearance is 67.5 mL/min (by C-G formula based on Cr of 0.72). ------------------------------------------------------------------------------------------------------------------  Recent Labs  07/07/15 0620  TSH 0.116*    Cardiac Enzymes  Recent Labs Lab 07/06/15 1118  TROPONINI <0.03   ------------------------------------------------------------------------------------------------------------------ Invalid input(s): POCBNP ---------------------------------------------------------------------------------------------------------------  RADIOLOGY: No results found.  EKG:  Orders placed or performed during the hospital  encounter of 07/06/15  . ED EKG  . ED EKG    ASSESSMENT AND PLAN:  Principal Problem:   Sepsis (HCC) Active Problems:   Acute pyelonephritis   Leukocytosis   Encephalopathy, metabolic   Bladder mass   CKD (chronic kidney disease) stage 2, GFR 60-89 ml/min   Hypokalemia   Generalized weakness   Hypothyroidism 1. Sepsis due to acute pyelonephritis , improved , continue Rocephin for now, follow culture results  2. Acute pyelonephritis , complicated , continue Rocephin. Urinary cultures are pending 3. Leukocytosis , improving with antibiotic therapy  4. Hypokalemia , supplemented orally , replenished magnesium level intravenously 5. Generalized weakness   , physical therapist recommends rehabilitation placement, getting psychiatrist involved for further recommendations as patient is being investigated by DSS was self neglect, questionable if patient capable to make a rational decisions about her medical care.  6. Metabolic encephalopathy, improved, but patient became very agitated today, unclear baseline, care management. Discussed with family who requested psychiatrist input, DSS investigating patient for self neglect    Management plans discussed with the patient, family and they are in agreement.   DRUG ALLERGIES: No Known Allergies  CODE STATUS:     Code Status Orders        Start     Ordered   07/06/15 1733  Full code   Continuous     07/06/15 1732      TOTAL TIME TAKING CARE OF THIS PATIENT: 40tes.    Katharina CaperVAICKUTE,Koula Venier M.D on 07/08/2015 at 2:19 PM  Between 7am to 6pm - Pager - [phone]  After 6pm go to [email or web address] - password [email or web address] [facility]  [facility]  Office  [phone]  CC: Primary care physician; Lauro RegulusANDERSON,MARSHALL W., MD

## 2015-07-08 NOTE — NC FL2 (Signed)
[place] MEDICAID FL2 LEVEL OF CARE SCREENING TOOL     IDENTIFICATION  Patient Name: KEYAUNA GRAEFE Birthdate: 1940-10-15 Sex: female Admission Date (Current Location): 07/06/2015  [place] and [place] Number:  [facility] )   Facility and Address:  [facility], [address] [place], [place] [address]      Provider Number: [id number]  Attending Physician Name and Address:  Katharina Caper, MD  Relative Name and Phone Number:       Current Level of Care: Hospital Recommended Level of Care: Skilled Nursing Facility Prior Approval Number:    Date Approved/Denied:   PASRR Number:  [id number])  Discharge Plan: SNF    Current Diagnoses: Patient Active Problem List   Diagnosis Date Noted  . CKD (chronic kidney disease) stage 2, GFR 60-89 ml/min 07/08/2015  . Acute pyelonephritis 07/08/2015  . Leukocytosis 07/08/2015  . Hypokalemia 07/08/2015  . Generalized weakness 07/08/2015  . Encephalopathy, metabolic 07/08/2015  . Hypothyroidism 07/08/2015  . Bladder mass 07/08/2015  . Sepsis (HCC) 07/06/2015    Orientation ACTIVITIES/SOCIAL BLADDER RESPIRATION    Self, Place  Active Incontinent Normal  BEHAVIORAL SYMPTOMS/MOOD NEUROLOGICAL BOWEL NUTRITION STATUS   (none )  (none ) Continent Diet (Diet: 2 Gram Sodium. )  PHYSICIAN VISITS COMMUNICATION OF NEEDS Height & Weight Skin  30 days Verbally  (162.6 cm) 209 lbs. Normal          AMBULATORY STATUS RESPIRATION    Assist extensive Normal      Personal Care Assistance Level of Assistance  Bathing, Feeding, Dressing Bathing Assistance: Limited assistance Feeding assistance: Independent Dressing Assistance: Limited assistance      Functional Limitations Info  Sight, Hearing, Speech Sight Info: Adequate Hearing Info: Adequate Speech Info: Adequate       SPECIAL CARE FACTORS FREQUENCY  PT (By licensed PT)     PT Frequency:  (5)             Additional Factors Info   Code Status Code Status Info:  (Full Code. )             Current Medications (07/08/2015): Current Facility-Administered Medications  Medication Dose Route Frequency Provider Last Rate Last Dose  . acetaminophen (TYLENOL) tablet 650 mg  650 mg Oral Q6H PRN Ramonita Lab, MD   650 mg at 07/08/15 0022   Or  . acetaminophen (TYLENOL) suppository 650 mg  650 mg Rectal Q6H PRN Ramonita Lab, MD      . cefTRIAXone (ROCEPHIN) 1 g in dextrose 5 % 50 mL IVPB  1 g Intravenous Q24H Ramonita Lab, MD   1 g at 07/07/15 1828  . docusate sodium (COLACE) capsule 100 mg  100 mg Oral BID Ramonita Lab, MD   100 mg at 07/08/15 0918  . enoxaparin (LOVENOX) injection 40 mg  40 mg Subcutaneous Q24H Ramonita Lab, MD   40 mg at 07/07/15 2305  . feeding supplement (ENSURE ENLIVE) (ENSURE ENLIVE) liquid 237 mL  237 mL Oral BID BM Katharina Caper, MD   237 mL at 07/08/15 1349  . ondansetron (ZOFRAN) tablet 4 mg  4 mg Oral Q6H PRN Ramonita Lab, MD       Or  . ondansetron (ZOFRAN) injection 4 mg  4 mg Intravenous Q6H PRN Aruna Gouru, MD      . sodium chloride 0.9 % injection 3 mL  3 mL Intravenous Q12H Ramonita Lab, MD   3 mL at 07/08/15 5621   Do not use this list as official  medication orders. Please verify with discharge summary.  Discharge Medications:   Medication List    TAKE these medications        cephALEXin 500 MG capsule  Commonly known as:  KEFLEX  Take 1 capsule (500 mg total) by mouth 2 (two) times daily.     levothyroxine 100 MCG tablet  Commonly known as:  SYNTHROID, LEVOTHROID  Take 1 tablet (100 mcg total) by mouth daily before breakfast.     potassium chloride 10 MEQ tablet  Commonly known as:  K-DUR,KLOR-CON  Take 1 tablet (10 mEq total) by mouth daily as needed.     torsemide 20 MG tablet  Commonly known as:  DEMADEX  Take 2 tablets (40 mg total) by mouth daily as needed.        Relevant Imaging Results:  Relevant Lab Results:  Recent Labs    Additional Information  (SSN:  [id number])  Haig ProphetMorgan, Bailey G, LCSW

## 2015-07-09 LAB — CREATININE, SERUM
Creatinine, Ser: 0.61 mg/dL (ref 0.44–1.00)
GFR calc Af Amer: >60 mL/min (ref >60)
GFR calc non Af Amer: >60 mL/min (ref >60)

## 2015-07-09 LAB — URINE CULTURE: SPECIAL REQUESTS: NORMAL

## 2015-07-09 MED ORDER — RISPERIDONE 0.5 MG PO TBDP: 0.5000 mg | ORAL_TABLET | Freq: Four times a day (QID) | ORAL | Status: DC | PRN

## 2015-07-09 MED ORDER — AMITRIPTYLINE HCL 150 MG PO TABS: 75.0000 mg | ORAL_TABLET | Freq: Every day | ORAL | Status: DC

## 2015-07-09 NOTE — Progress Notes (Signed)
Pt is alert with intermittent confusion, c/o generalized body discomfort and request tylenol, bed bound at this time, potassium serum level normalized, poor appetite, [facility] is [facility], CSW spoke with pt's sister and notified of patients discharge, pt will be transported via EMS. [facility], d/c on keflex for UTI. Uneventful shift

## 2015-07-09 NOTE — Progress Notes (Signed)
Clinical Social Worker informed by  Auburn BilberryPATEL, SHREYANG, MD that patient is medically ready to discharge to SNF, Patient and sister Lynden AngCathy are in a agreement with plan.  Call to [facility] to confirm that patient's bed is ready. Provided patient's room number 78 and number to call for report [phone] . All discharge information faxed to  Facility. Rx's added to discharge packet.   RN will call report and [facility].  Sammuel Hineseborah Zia Najera. Theresia MajorsLCSWA, MSW Clinical Social Work Department [phone] 12:02 PM

## 2015-07-09 NOTE — Discharge Summary (Addendum)
Marie Mullen, 74 y.o., DOB [birth year], MRN [id number]. Admission date: 07/06/2015 Discharge Date 07/09/2015 Primary MD Lauro Regulus., MD Admitting Physician Ramonita Lab, MD  Admission Diagnosis  Delirium [R41.0] Generalized weakness [R53.1] Cystitis [N30.90]  Discharge Diagnosis   Principal Problem:   Sepsis due to acute pyelonephritis Active Problems:   CKD (chronic kidney disease) stage 2, GFR 60-89 ml/min   Acute pyelonephritis   Leukocytosis   Hypokalemia replace   Generalized weakness   Encephalopathy, metabolic   Hypothyroidism, however TSH is is decreased and free T4 is also elevated    Blad Synthroid dose decreasedder mass   Dementia with behavioral disturbance         Hospital Course patient is 74 year old Caucasian female with past medical history significant for , left extremity edema, weakness, hypothyroidism and recent bladder mass status post biopsy who presents to the hospital with generalized weakness, fall, inability to get up after fall. She was noted to be confused . Initial patient's labs were remarkable for hyperglycemia and elevated white blood cell count. Urinalysis was remarkable for pyuria and bacteriuria. Patient's urine cultures were taken and antibiotic was started. Patient has DSS following her at home due to inability to take care of her at home. Patient was admitted with pyelonephritis. Initially she refused to go to rehabilitation. She was seen by psychiatry to determine her competency but evaluation from psychiatry was inconclusive regarding her competency. I discussed with the patient regarding going to rehabilitation when she is agreeable to go there short term. She very concerned her Synthroid dose. Her TSH was checked here and was low and free T4 was elevated. Suggesting that she was getting too much Synthroid her dose has been adjusted. She is placed on oral antibiotics for the suspected pyelonephritis. At this time she'll need for further  aggressive rehabilitation.            Consults  None  Significant Tests:  See full reports for all details      Dg Chest 2 View  07/06/2015  CLINICAL DATA:  Patient was found down at home.  Weakness. EXAM: CHEST  2 VIEW COMPARISON:  09/20/2014 FINDINGS: Heart size and pulmonary vascularity are normal and the lungs are clear. Prominent left pericardial fat pad. Calcified breast implants. Diffuse osteopenia with accentuation of the thoracic kyphosis. No acute osseous abnormality. No effusions. IMPRESSION: No acute abnormalities. Electronically Signed   By: Francene Boyers M.D.   On: 07/06/2015 12:33   Dg Pelvis 1-2 Views  07/06/2015  CLINICAL DATA:  Generalized weakness.  Found on floor. EXAM: PELVIS - 1-2 VIEW COMPARISON:  None. FINDINGS: Single AP view of the pelvis demonstrates osteopenia. Femoral heads are located. No acute fracture. Sacroiliac joints are symmetric. IMPRESSION: No acute osseous abnormality. Electronically Signed   By: Jeronimo Greaves M.D.   On: 07/06/2015 12:30   Ct Head Wo Contrast  07/06/2015  CLINICAL DATA:  Increasing weakness with loss of consciousness for 2 days. Found down. Initial encounter. EXAM: CT HEAD WITHOUT CONTRAST TECHNIQUE: Contiguous axial images were obtained from the base of the skull through the vertex without intravenous contrast. COMPARISON:  None. FINDINGS: There is no evidence of acute intracranial hemorrhage, mass lesion, brain edema or extra-axial fluid collection. The ventricles and subarachnoid spaces are mildly prominent for age. There is no CT evidence of acute cortical infarction. There is mild periventricular low-density, likely due to chronic small vessel ischemic change. There is a possible chronic infarct or prominent perivascular space in the right thalamus. Intracranial  vascular calcifications are noted. The visualized paranasal sinuses, mastoid air cells and middle ears are clear. The calvarium is demineralized without evidence of acute  fracture. IMPRESSION: No acute intracranial findings identified. Atrophy and probable mild chronic small vessel ischemic changes. Electronically Signed   By: Carey BullocksWilliam  Veazey M.D.   On: 07/06/2015 13:17       Today   Subjective:   Marie Mullen  feels okay denies any chest pain or shortness of breath and I had to discuss with her and the length regarding discharge to nursing home she is agreeable  Objective:   Blood pressure 124/59, pulse 79, temperature 98.2 F (36.8 C), temperature source Oral, resp. rate 19, height 5' 4.5" (1.638 m), weight 85.049 kg (187 lb 8 oz), SpO2 95 %.  .  Intake/Output Summary (Last 24 hours) at 07/09/15 1345 Last data filed at 07/09/15 0900  Gross per 24 hour  Intake      0 ml  Output      0 ml  Net      0 ml    Exam VITAL SIGNS: Blood pressure 124/59, pulse 79, temperature 98.2 F (36.8 C), temperature source Oral, resp. rate 19, height 5' 4.5" (1.638 m), weight 85.049 kg (187 lb 8 oz), SpO2 95 %.  GENERAL:  74 y.o.-year-old patient lying in the bed with no acute distress.  EYES: Pupils equal, round, reactive to light and accommodation. No scleral icterus. Extraocular muscles intact.  HEENT: Head atraumatic, normocephalic. Oropharynx and nasopharynx clear.  NECK:  Supple, no jugular venous distention. No thyroid enlargement, no tenderness.  LUNGS: Normal breath sounds bilaterally, no wheezing, rales,rhonchi or crepitation. No use of accessory muscles of respiration.  CARDIOVASCULAR: S1, S2 normal. No murmurs, rubs, or gallops.  ABDOMEN: Soft, nontender, nondistended. Bowel sounds present. No organomegaly or mass.  EXTREMITIES: No pedal edema, cyanosis, or clubbing.  NEUROLOGIC: Cranial nerves II through XII are intact. Muscle strength 5/5 in all extremities. Sensation intact. Gait not checked.  PSYCHIATRIC: The patient is alert and oriented x 3.  SKIN: No obvious rash, lesion, or ulcer.   Data Review     CBC w Diff:  Lab Results  Component Value  Date   WBC 11.3* 07/07/2015   WBC 5.8 09/20/2014   HGB 12.7 07/07/2015   HGB 13.3 09/20/2014   HCT 37.2 07/07/2015   HCT 40.0 09/20/2014   PLT 250 07/07/2015   PLT 211 09/20/2014   LYMPHOPCT 5 07/06/2015   LYMPHOPCT 10.7 09/20/2014   MONOPCT 8 07/06/2015   MONOPCT 6.5 09/20/2014   EOSPCT 0 07/06/2015   EOSPCT 1.6 09/20/2014   BASOPCT 0 07/06/2015   BASOPCT 0.9 09/20/2014   CMP:  Lab Results  Component Value Date   NA 142 07/07/2015   NA 139 09/20/2014   K 3.7 07/07/2015   K 4.2 09/20/2014   CL 106 07/07/2015   CL 103 09/20/2014   CO2 26 07/07/2015   CO2 31 09/20/2014   BUN 16 07/07/2015   BUN 17 09/20/2014   CREATININE 0.61 07/09/2015   CREATININE 1.18 09/20/2014   PROT 6.9 07/06/2015   ALBUMIN 3.3* 07/06/2015   BILITOT 1.3* 07/06/2015   ALKPHOS 105 07/06/2015   AST 29 07/06/2015   ALT 15 07/06/2015  .  Micro Results Recent Results (from the past 240 hour(s))  Urine culture     Status: None   Collection Time: 07/06/15 11:18 AM  Result Value Ref Range Status   Specimen Description URINE, RANDOM  Final  Special Requests NONE  Final   Culture   Final    MULTIPLE SPECIES PRESENT, SUGGEST RECOLLECTION Probably contamination with fecal flora.    Report Status 07/07/2015 FINAL  Final  Urine culture     Status: None   Collection Time: 07/07/15  2:39 PM  Result Value Ref Range Status   Specimen Description URINE, CLEAN CATCH  Final   Special Requests Normal  Final   Culture MULTIPLE SPECIES PRESENT, SUGGEST RECOLLECTION  Final   Report Status 07/09/2015 FINAL  Final        Code Status Orders        Start     Ordered   07/06/15 1733  Full code   Continuous     07/06/15 1732              Follow-up Information    Follow up with Lauro Regulus., MD In 7 days.   Specialty:  Internal Medicine   Contact information:   [address] [facility] [place] [address] [phone]       Follow up with [facility] .   Specialty:  Skilled Nursing Facility   Contact information:   [address] [place] [address] [phone]      Discharge Medications     Medication List    STOP taking these medications        potassium chloride 10 MEQ tablet  Commonly known as:  K-DUR,KLOR-CON      TAKE these medications        amitriptyline 150 MG tablet  Commonly known as:  ELAVIL  Take 0.5 tablets (75 mg total) by mouth at bedtime.     cephALEXin 500 MG capsule  Commonly known as:  KEFLEX  Take 1 capsule (500 mg total) by mouth 2 (two) times daily.     levothyroxine 100 MCG tablet  Commonly known as:  SYNTHROID, LEVOTHROID  Take 1 tablet (100 mcg total) by mouth daily before breakfast.     risperiDONE 0.5 MG disintegrating tablet  Commonly known as:  RISPERDAL M-TABS  Take 1 tablet (0.5 mg total) by mouth every 6 (six) hours as needed (Agitation).     torsemide 20 MG tablet  Commonly known as:  DEMADEX  Take 2 tablets (40 mg total) by mouth daily as needed.           Total Time in preparing paper work, data evaluation and todays exam - 35 minutes  Auburn Bilberry M.D on 07/09/2015 at 1:45 PM  [facility]   Office  [phone]

## 2015-07-09 NOTE — Discharge Instructions (Addendum)
°  DIET:  Regular diet  DISCHARGE CONDITION:  Stable  ACTIVITY:  Activity as tolerated  OXYGEN:  Home Oxygen: No.   Oxygen Delivery: room air  DISCHARGE LOCATION:  home    ADDITIONAL DISCHARGE INSTRUCTION:pt eval and treat Check bmp in 2days dx "hypokalemia"   If you experience worsening of your admission symptoms, develop shortness of breath, life threatening emergency, suicidal or homicidal thoughts you must seek medical attention immediately by calling 911 or calling your MD immediately  if symptoms less severe.  You Must read complete instructions/literature along with all the possible adverse reactions/side effects for all the Medicines you take and that have been prescribed to you. Take any new Medicines after you have completely understood and accpet all the possible adverse reactions/side effects.   Please note  You were cared for by a hospitalist during your hospital stay. If you have any questions about your discharge medications or the care you received while you were in the hospital after you are discharged, you can call the unit and asked to speak with the hospitalist on call if the hospitalist that took care of you is not available. Once you are discharged, your primary care physician will handle any further medical issues. Please note that NO REFILLS for any discharge medications will be authorized once you are discharged, as it is imperative that you return to your primary care physician (or establish a relationship with a primary care physician if you do not have one) for your aftercare needs so that they can reassess your need for medications and monitor your lab values.

## 2015-07-09 NOTE — Progress Notes (Signed)
Nursing home had question about potassium prn that was directed to CSW, Gavin PoundDeborah. MD notified that SNF can not give prn potassium with checking potassium serum daily, Dr. Allena KatzPatel canceled prn potassium, Morrie SheldonAshley at [facility] notified and reported that MD at facility already addressed matter.

## 2015-07-09 NOTE — Clinical Social Work Placement (Signed)
   CLINICAL SOCIAL WORK PLACEMENT  NOTE  Date:  07/09/2015  Patient Details  Name: Marie Mullen MRN: [id number] Date of Birth: 1941-01-24  Clinical Social Work is seeking post-discharge placement for this patient at the Skilled  Nursing Facility level of care (*CSW will initial, date and re-position this form in  chart as items are completed):  Yes   Patient/family provided with [place] Clinical Social Work Department's list of facilities offering this level of care within the geographic area requested by the patient (or if unable, by the patient's family).  Yes   Patient/family informed of their freedom to choose among providers that offer the needed level of care, that participate in Medicare, Medicaid or managed care program needed by the patient, have an available bed and are willing to accept the patient.  Yes   Patient/family informed of [place]'s ownership interest in [facility] and [facility], as well as of the fact that they are under no obligation to receive care at these facilities.  PASRR submitted to EDS on       PASRR number received on       Existing PASRR number confirmed on 07/09/15     FL2 transmitted to all facilities in geographic area requested by pt/family on 07/08/15     FL2 transmitted to all facilities within larger geographic area on       Patient informed that his/her managed care company has contracts with or will negotiate with certain facilities, including the following:        Yes   Patient/family informed of bed offers received.  Patient chooses bed at  [facility])     Physician recommends and patient chooses bed at      Patient to be transferred to  [facility] on 07/09/15.  Patient to be transferred to facility by  (EMS)     Patient family notified on 07/09/15 of transfer.  Name of family member notified:   (Sister Lynden AngCathy)     PHYSICIAN Please sign FL2     Additional Comment:     _______________________________________________ Soundra PilonMoore, Angeles Zehner H, LCSW 07/09/2015, 11:58 AM

## 2015-07-09 NOTE — Progress Notes (Signed)
EMS arrived for patient at 15:10

## 2015-07-09 NOTE — Plan of Care (Signed)
Problem: Physical Regulation: Goal: Signs and symptoms of infection will decrease Outcome: Progressing No c/o pain. VSS, afebrile. IV Abx given as ordered. Urine remains malodorous. Skin care provided- excoriation on buttocks and peri area. SNF placement pending.

## 2015-08-14 ENCOUNTER — Other Ambulatory Visit
Admission: RE | Admit: 2015-08-14 | Discharge: 2015-08-14 | Disposition: A | Discharge status: Home / Self Care | Payer: Medicare Other | Source: Ambulatory Visit | Attending: Specialist | Admitting: Specialist

## 2015-08-14 DIAGNOSIS — Z029 Encounter for administrative examinations, unspecified: Secondary | ICD-10-CM | POA: Diagnosis present

## 2015-08-14 LAB — URINALYSIS COMPLETE WITH MICROSCOPIC (ARMC ONLY)
BILIRUBIN URINE: NEGATIVE
GLUCOSE, UA: NEGATIVE mg/dL
KETONES UR: NEGATIVE mg/dL
LEUKOCYTES UA: NEGATIVE
NITRITE: NEGATIVE
PH: 8 (ref 5.0–8.0)
Protein, ur: 30 mg/dL — AB
Specific Gravity, Urine: 1.008 (ref 1.005–1.030)

## 2015-08-16 LAB — URINE CULTURE: Culture: >=100000

## 2015-09-15 ENCOUNTER — Inpatient Hospital Stay
Admission: EM | Admit: 2015-09-15 | Discharge: 2015-09-19 | DRG: 884 | Disposition: A | Discharge status: Home / Self Care | Payer: Medicare Other | Attending: Specialist | Admitting: Specialist

## 2015-09-15 ENCOUNTER — Emergency Department: Payer: Medicare Other

## 2015-09-15 ENCOUNTER — Encounter: Payer: Self-pay | Admitting: *Deleted

## 2015-09-15 DIAGNOSIS — Z8551 Personal history of malignant neoplasm of bladder: Secondary | ICD-10-CM

## 2015-09-15 DIAGNOSIS — N179 Acute kidney failure, unspecified: Secondary | ICD-10-CM | POA: Diagnosis present

## 2015-09-15 DIAGNOSIS — E8889 Other specified metabolic disorders: Secondary | ICD-10-CM | POA: Diagnosis present

## 2015-09-15 DIAGNOSIS — I456 Pre-excitation syndrome: Secondary | ICD-10-CM | POA: Diagnosis present

## 2015-09-15 DIAGNOSIS — Z833 Family history of diabetes mellitus: Secondary | ICD-10-CM | POA: Diagnosis not present

## 2015-09-15 DIAGNOSIS — E872 Acidosis: Secondary | ICD-10-CM | POA: Diagnosis present

## 2015-09-15 DIAGNOSIS — Z8674 Personal history of sudden cardiac arrest: Secondary | ICD-10-CM | POA: Diagnosis not present

## 2015-09-15 DIAGNOSIS — Z8701 Personal history of pneumonia (recurrent): Secondary | ICD-10-CM

## 2015-09-15 DIAGNOSIS — E860 Dehydration: Secondary | ICD-10-CM | POA: Diagnosis present

## 2015-09-15 DIAGNOSIS — R000 Tachycardia, unspecified: Secondary | ICD-10-CM | POA: Diagnosis present

## 2015-09-15 DIAGNOSIS — E876 Hypokalemia: Secondary | ICD-10-CM | POA: Diagnosis present

## 2015-09-15 DIAGNOSIS — G9340 Encephalopathy, unspecified: Secondary | ICD-10-CM | POA: Diagnosis present

## 2015-09-15 DIAGNOSIS — Z9882 Breast implant status: Secondary | ICD-10-CM | POA: Diagnosis not present

## 2015-09-15 DIAGNOSIS — F0391 Unspecified dementia with behavioral disturbance: Secondary | ICD-10-CM | POA: Diagnosis not present

## 2015-09-15 DIAGNOSIS — E039 Hypothyroidism, unspecified: Secondary | ICD-10-CM | POA: Diagnosis present

## 2015-09-15 DIAGNOSIS — N3000 Acute cystitis without hematuria: Secondary | ICD-10-CM | POA: Diagnosis present

## 2015-09-15 DIAGNOSIS — N390 Urinary tract infection, site not specified: Secondary | ICD-10-CM | POA: Diagnosis present

## 2015-09-15 DIAGNOSIS — N189 Chronic kidney disease, unspecified: Secondary | ICD-10-CM | POA: Diagnosis present

## 2015-09-15 DIAGNOSIS — R531 Weakness: Secondary | ICD-10-CM | POA: Diagnosis present

## 2015-09-15 LAB — URINALYSIS COMPLETE WITH MICROSCOPIC (ARMC ONLY)
BILIRUBIN URINE: NEGATIVE
GLUCOSE, UA: NEGATIVE mg/dL
NITRITE: NEGATIVE
Protein, ur: 100 mg/dL — AB
Specific Gravity, Urine: 1.015 (ref 1.005–1.030)
pH: 6 (ref 5.0–8.0)

## 2015-09-15 LAB — BASIC METABOLIC PANEL
Anion gap: 20 — ABNORMAL HIGH (ref 5–15)
BUN: 20 mg/dL (ref 6–20)
CALCIUM: 10 mg/dL (ref 8.9–10.3)
CO2: 13 mmol/L — ABNORMAL LOW (ref 22–32)
CREATININE: 1.3 mg/dL — AB (ref 0.44–1.00)
Chloride: 103 mmol/L (ref 101–111)
GFR, EST AFRICAN AMERICAN: 45 mL/min — AB (ref >60)
GFR, EST NON AFRICAN AMERICAN: 39 mL/min — AB (ref >60)
Glucose, Bld: 158 mg/dL — ABNORMAL HIGH (ref 65–99)
Potassium: 3.7 mmol/L (ref 3.5–5.1)
SODIUM: 136 mmol/L (ref 135–145)

## 2015-09-15 LAB — CBC
HCT: 41.1 % (ref 35.0–47.0)
Hemoglobin: 13.3 g/dL (ref 12.0–16.0)
MCH: 29.9 pg (ref 26.0–34.0)
MCHC: 32.5 g/dL (ref 32.0–36.0)
MCV: 92.1 fL (ref 80.0–100.0)
PLATELETS: 234 10*3/uL (ref 150–440)
RBC: 4.46 MIL/uL (ref 3.80–5.20)
RDW: 18.2 % — ABNORMAL HIGH (ref 11.5–14.5)
WBC: 12.9 10*3/uL — AB (ref 3.6–11.0)

## 2015-09-15 MED ORDER — CEFTRIAXONE SODIUM 1 G IJ SOLR
1.0000 g | Freq: Once | INTRAMUSCULAR | Status: AC
  Administered 2015-09-15: 1 g via INTRAVENOUS
  Filled 2015-09-15: qty 10

## 2015-09-15 MED ORDER — DEXTROSE 5 % IV SOLN
1.0000 g | INTRAVENOUS | Status: DC
  Administered 2015-09-16 – 2015-09-17 (×2): 1 g via INTRAVENOUS
  Filled 2015-09-15 (×3): qty 10

## 2015-09-15 MED ORDER — MORPHINE SULFATE (PF) 2 MG/ML IV SOLN: 2.0000 mg | INTRAVENOUS | Status: DC | PRN

## 2015-09-15 MED ORDER — OXYCODONE HCL 5 MG PO TABS: 5.0000 mg | ORAL_TABLET | ORAL | Status: DC | PRN

## 2015-09-15 MED ORDER — ONDANSETRON HCL 4 MG/2ML IJ SOLN: 4.0000 mg | Freq: Four times a day (QID) | INTRAMUSCULAR | Status: DC | PRN

## 2015-09-15 MED ORDER — HEPARIN SODIUM (PORCINE) 5000 UNIT/ML IJ SOLN
5000.0000 [IU] | Freq: Three times a day (TID) | INTRAMUSCULAR | Status: DC
  Administered 2015-09-15 – 2015-09-19 (×13): 5000 [IU] via SUBCUTANEOUS
  Filled 2015-09-15 (×13): qty 1

## 2015-09-15 MED ORDER — SODIUM CHLORIDE 0.9 % IV SOLN
Freq: Once | INTRAVENOUS | Status: AC
  Administered 2015-09-15: 17:00:00 via INTRAVENOUS

## 2015-09-15 MED ORDER — LEVOTHYROXINE SODIUM 150 MCG PO TABS
150.0000 ug | ORAL_TABLET | Freq: Every day | ORAL | Status: DC
  Administered 2015-09-16 – 2015-09-19 (×4): 150 ug via ORAL
  Filled 2015-09-15 (×4): qty 1

## 2015-09-15 MED ORDER — ONDANSETRON HCL 4 MG PO TABS: 4.0000 mg | ORAL_TABLET | Freq: Four times a day (QID) | ORAL | Status: DC | PRN

## 2015-09-15 MED ORDER — ACETAMINOPHEN 650 MG RE SUPP: 650.0000 mg | Freq: Four times a day (QID) | RECTAL | Status: DC | PRN

## 2015-09-15 MED ORDER — SODIUM CHLORIDE 0.9 % IV SOLN
INTRAVENOUS | Status: DC
  Administered 2015-09-15 – 2015-09-17 (×3): via INTRAVENOUS

## 2015-09-15 MED ORDER — ACETAMINOPHEN 325 MG PO TABS: 650.0000 mg | ORAL_TABLET | Freq: Four times a day (QID) | ORAL | Status: DC | PRN

## 2015-09-15 NOTE — ED Notes (Addendum)
Pt placed on bed pan, tolerated well, no acute distress noted at this time. Pt cleaned up, sheets changed new clean brief placed.
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
## 2015-09-15 NOTE — H&P (Signed)
[facility] - [place] at [facility]   PATIENT NAME: Marie Mullen    MR#:  [id number]  DATE OF BIRTH:  1941-05-28   DATE OF ADMISSION:  09/15/2015  PRIMARY CARE PHYSICIAN: Lauro Regulus., MD   REQUESTING/REFERRING PHYSICIAN: Carollee Massed  CHIEF COMPLAINT:   Chief Complaint  Patient presents with  . Weakness   altered mental status  HISTORY OF PRESENT ILLNESS:  Marie Mullen  is a 75 y.o. female with a known history of hypothyroidism unspecified who is presenting the hospital after being found on the floor by social services. Apparently they have been following her for her general inability to care for herself. Never the when to assess her today and they found no answer actually firefighters into her front door where they found her laying on the floor in deplorable conditions. Where she stated "I always just lay the floor" she denies any further complaint. On arrival to the emergency department she was noted be tachycardic despite fluid resuscitation  PAST MEDICAL HISTORY:   Past Medical History  Diagnosis Date  . Eczema   . Hypothyroidism   . WPW (Wolff-Parkinson-White syndrome)   . Chronic kidney disease   . Lower extremity edema   . Weakness of both legs     knees  . Complication of anesthesia     slow to wake up   . Cardiac arrest (HCC) 1990   . Pneumonia     hx of   . Headache     PAST SURGICAL HISTORY:   Past Surgical History  Procedure Laterality Date  . Abdominal hysterectomy    . Breast enhancement surgery Bilateral   . Wpw N/A   . Cataract extraction Bilateral   . Transurethral resection of bladder tumor N/A 12/28/2014    Procedure: TRANSURETHRAL RESECTION OF BLADDER TUMOR (TURBT);  Surgeon: Vanna Scotland, MD;  Location: [facility] ORS;  Service: Urology;  Laterality: N/A;  . Cystoscopy w/ retrogrades Bilateral 12/28/2014    Procedure: CYSTOSCOPY WITH RETROGRADE PYELOGRAM;  Surgeon: Vanna Scotland, MD;  Location: [facility] ORS;  Service:  Urology;  Laterality: Bilateral;  . Heart mapping       SOCIAL HISTORY:   Social History  Substance Use Topics  . Smoking status: Never Smoker   . Smokeless tobacco: Never Used  . Alcohol Use: No    FAMILY HISTORY:   Family History  Problem Relation Age of Onset  . Diabetes Neg Hx     DRUG ALLERGIES:  No Known Allergies  REVIEW OF SYSTEMS:  REVIEW OF SYSTEMS:  CONSTITUTIONAL: Denies fevers, chills, positive fatigue, weakness.  EYES: Denies blurred vision, double vision, or eye pain.  EARS, NOSE, THROAT: Denies tinnitus, ear pain, hearing loss.  RESPIRATORY: denies cough, shortness of breath, wheezing  CARDIOVASCULAR: Denies chest pain, palpitations, edema.  GASTROINTESTINAL: Denies nausea, vomiting, diarrhea, abdominal pain.  GENITOURINARY: Denies dysuria, hematuria.  ENDOCRINE: Denies nocturia or thyroid problems. HEMATOLOGIC AND LYMPHATIC: Denies easy bruising or bleeding.  SKIN: Denies rash or lesions.  MUSCULOSKELETAL: Denies pain in neck, back, shoulder, knees, hips, or further arthritic symptoms.  NEUROLOGIC: Denies paralysis, paresthesias.  PSYCHIATRIC: Denies anxiety or depressive symptoms. Otherwise full review of systems performed by me is negative.   MEDICATIONS AT HOME:   Prior to Admission medications   Medication Sig Start Date End Date Taking? Authorizing Provider  levothyroxine (SYNTHROID, LEVOTHROID) 150 MCG tablet Take 150 mcg by mouth daily before breakfast.   Yes Historical Provider, MD  potassium chloride (K-DUR) 10 MEQ tablet Take  10 mEq by mouth daily.   Yes Historical Provider, MD      VITAL SIGNS:  Blood pressure 122/56, pulse 99, temperature 97.5 F (36.4 C), temperature source Oral, resp. rate 21, height  (1.727 m), weight 169 lb (76.658 kg), SpO2 98 %.  PHYSICAL EXAMINATION:  VITAL SIGNS: Filed Vitals:   09/15/15 2000 09/15/15 2055  BP: 143/56 122/56  Pulse: 101 99  Temp:    Resp: 13 21   GENERAL:74 y.o.female currently  in no acute distress. Strong urine odor HEAD: Normocephalic, atraumatic.  EYES: Pupils equal, round, reactive to light. Extraocular muscles intact. No scleral icterus.  MOUTH: Dry mucosal membrane. Dentition intact. No abscess noted.  EAR, NOSE, THROAT: Clear without exudates. No external lesions.  NECK: Supple. No thyromegaly. No nodules. No JVD.  PULMONARY: Clear to ascultation, without wheeze rails or rhonci. No use of accessory muscles, Good respiratory effort. good air entry bilaterally CHEST: Nontender to palpation.  CARDIOVASCULAR: S1 and S2. Tachycardic. No murmurs, rubs, or gallops. No edema. Pedal pulses 2+ bilaterally.  GASTROINTESTINAL: Soft, nontender, nondistended. No masses. Positive bowel sounds. No hepatosplenomegaly.  MUSCULOSKELETAL: No swelling, clubbing, or edema. Range of motion full in all extremities.  NEUROLOGIC: Cranial nerves II through XII are intact. No gross focal neurological deficits. Sensation intact. Reflexes intact.  SKIN: Minimal erythema right shin without skin breakdown, otherwise No ulceration, lesions, rashes, or cyanosis. Skin warm and dry. Turgor intact.  PSYCHIATRIC: Mood, affect anxious. The patient is awake, alert and oriented x 2 (person place, when asked the date she stated February 1917). Insight, judgment poor at this time.    LABORATORY PANEL:   CBC  Recent Labs Lab 09/15/15 1708  WBC 12.9*  HGB 13.3  HCT 41.1  PLT 234   ------------------------------------------------------------------------------------------------------------------  Chemistries   Recent Labs Lab 09/15/15 1708  NA 136  K 3.7  CL 103  CO2 13*  GLUCOSE 158*  BUN 20  CREATININE 1.30*  CALCIUM 10.0   ------------------------------------------------------------------------------------------------------------------  Cardiac Enzymes No results for input(s): TROPONINI in the last 168  hours. ------------------------------------------------------------------------------------------------------------------  RADIOLOGY:  Dg Chest 1 View  09/15/2015  CLINICAL DATA:  Patient found down today.  Initial encounter. EXAM: CHEST 1 VIEW COMPARISON:  PA and lateral chest 07/06/2015 and single view of the chest 09/20/2014. FINDINGS: Chronic, mild blunting of left costophrenic angle could be due to a tiny effusion or scar. No right effusion is seen. The lungs are clear. Heart size is upper normal. No pneumothorax. Calcified breast implants noted. IMPRESSION: No acute disease. Electronically Signed   By: Drusilla Kanner M.D.   On: 09/15/2015 18:01    EKG:   Orders placed or performed during the hospital encounter of 09/15/15  . ED EKG  . ED EKG  . EKG 12-Lead  . EKG 12-Lead    IMPRESSION AND PLAN:   75 year old Caucasian female history of hypothyroidism unspecified presenting with altered mental status after being found the floor  1. Acute encephalopathy: Likely secondary to urinary tract infection: Ceftriaxone, follow urine culture, IV fluid hydration, avoid further sedating agents, consult physical therapy and social work for likely placement 2. Hypothyroidism: Check TSH continue Synthroid 3. Acute kidney injury: Gentle IV fluid hydration and follow urine output/renal function 4. Venous thromboembolism prophylactic: Heparin subcutaneous    All the records are reviewed and case discussed with ED provider. Management plans discussed with the patient, family and they are in agreement.  CODE STATUS: Full  TOTAL TIME TAKING CARE OF THIS PATIENT: 35  minutes.    Netanya Yazdani,  Mardi Mainland.D on 09/15/2015 at 9:23 PM  Between 7am to 6pm - Pager - [phone]  After 6pm: House Pager: - [phone]  [facility]  Office  [phone]  CC: Primary care physician; Lauro Regulus., MD

## 2015-09-15 NOTE — ED Notes (Addendum)
Pt to ED from home via [facility] after social worker following pt's case was unable to get pt to respond to door during routine house call. Fire Dept used forced entry to get into home to find pt laying on the floor. Per pt "I always lay on the floor, I did not fall, nothing is wrong" Per EMS pt unable to ambulate without assistance, living in deplorable conditions, alone. Pt currently followed by adult protective services, due to pt's inability to care for self. Upon arrival, pt AAOx4, denies any pain, or injuries. No acute distress noted, pt tachy at 118. MD Carollee Massed at bedside upon arrival.

## 2015-09-15 NOTE — ED Notes (Addendum)
RN spoke to pt's Social Worker, Randi, in regards to pt's status. Donnal Debar would like to be contacted once pt plan is established. Randi voiced opinion that pt is not competent to continue living at home, alone. Per Donnal Debar, pt house is deplorable and not good living quarters, and requesting psych consult.  Cell - [phone] Office [phone]

## 2015-09-15 NOTE — ED Notes (Signed)
MD Hower at bedside. 

## 2015-09-16 DIAGNOSIS — F0391 Unspecified dementia with behavioral disturbance: Principal | ICD-10-CM

## 2015-09-16 LAB — CBC
HCT: 32.3 % — ABNORMAL LOW (ref 35.0–47.0)
Hemoglobin: 10.9 g/dL — ABNORMAL LOW (ref 12.0–16.0)
MCH: 30.9 pg (ref 26.0–34.0)
MCHC: 33.9 g/dL (ref 32.0–36.0)
MCV: 91.1 fL (ref 80.0–100.0)
PLATELETS: 189 10*3/uL (ref 150–440)
RBC: 3.54 MIL/uL — AB (ref 3.80–5.20)
RDW: 17.9 % — ABNORMAL HIGH (ref 11.5–14.5)
WBC: 7.5 10*3/uL (ref 3.6–11.0)

## 2015-09-16 LAB — BASIC METABOLIC PANEL
Anion gap: 10 (ref 5–15)
BUN: 18 mg/dL (ref 6–20)
CALCIUM: 9 mg/dL (ref 8.9–10.3)
CO2: 18 mmol/L — AB (ref 22–32)
CREATININE: 0.85 mg/dL (ref 0.44–1.00)
Chloride: 110 mmol/L (ref 101–111)
Glucose, Bld: 81 mg/dL (ref 65–99)
Potassium: 3.3 mmol/L — ABNORMAL LOW (ref 3.5–5.1)
SODIUM: 138 mmol/L (ref 135–145)

## 2015-09-16 LAB — MAGNESIUM: Magnesium: 1.6 mg/dL — ABNORMAL LOW (ref 1.7–2.4)

## 2015-09-16 LAB — TSH: TSH: 0.047 u[IU]/mL — AB (ref 0.350–4.500)

## 2015-09-16 MED ORDER — MAGNESIUM SULFATE 2 GM/50ML IV SOLN
2.0000 g | Freq: Once | INTRAVENOUS | Status: AC
  Administered 2015-09-16: 14:00:00 2 g via INTRAVENOUS
  Filled 2015-09-16: qty 50

## 2015-09-16 MED ORDER — POTASSIUM CHLORIDE CRYS ER 10 MEQ PO TBCR
10.0000 meq | EXTENDED_RELEASE_TABLET | Freq: Every day | ORAL | Status: DC
  Administered 2015-09-17: 10 meq via ORAL
  Filled 2015-09-16: qty 1

## 2015-09-16 MED ORDER — RISPERIDONE 0.25 MG PO TABS
0.2500 mg | ORAL_TABLET | Freq: Four times a day (QID) | ORAL | Status: DC | PRN
Start: 2015-09-16 — End: 2015-09-20
  Filled 2015-09-16: qty 1

## 2015-09-16 MED ORDER — POTASSIUM CHLORIDE CRYS ER 20 MEQ PO TBCR
20.0000 meq | EXTENDED_RELEASE_TABLET | Freq: Once | ORAL | Status: AC
  Administered 2015-09-16: 20 meq via ORAL
  Filled 2015-09-16: qty 1

## 2015-09-16 NOTE — Consult Note (Signed)
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 Consult cited concerns about capacity. Referring Physician:  Manuella Ghazi Patient Identification: Marie Mullen MRN:  [id number] Principal Diagnosis: Dementia with behavioral disturbance Diagnosis:   Patient Active Problem List   Diagnosis Date Noted  . Encephalopathy [G93.40] 09/15/2015  . Urinary tract infection [N39.0] 09/15/2015  . 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 Hypothyroidism [E03.9] 07/08/2015  . Bladder mass [N32.89] 07/08/2015  . Dementia with behavioral disturbance [F03.91]     Total Time spent with patient: 1 hour  Subjective:   Marie Mullen is a 75 y.o. female patient admitted with "this is all because of my sisters".  HPI:  Patient interviewed. Chart reviewed. Reviewed old notes including my consult from November. Reviewed labs and current data. This 75 year old woman was brought to the emergency room after her family raised concern because she would not come to the door. Eventually they had emergency professionals actually break down her door and brought her to the hospital. The patient tells me she thinks the only reason she is here is because her sisters are trying to steal her money from her. She is very focused on this and this is the topic she returns to on almost every question. She says she thinks her sisters are trying to take money away from her and that's why they're having people break into her house. Patient denies having any actual medical complaints right now. She is able to describe how she was at home sitting on the floor of her house when the firefighters broke the door down. She says that she was just sitting on the floor in order to watch television and that that is a normal thing for her. Patient states that  she is been taking her medication as usual. When asked about the allegedly "deplorable" state of her house the patient says that this is true but blames it on other people. She appears to believe that there were some other people possibly in the employ of her sister, who had been staying in her house while she was in [place]. Patient denies depression. Denies suicidal or homicidal ideation. She denies believing that she has any hallucinations. On interview the patient is alert and oriented 4. She is able to repeat 3 words immediately and remembers 2 out of 3 at 3 minutes. She is able to describe her medical conditions and the medicines that she has taken for them. As noted above however she is quite fixated on the idea that her family is taking money from her to a great degree.  Social history: Patient lives alone in her own house. 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 She has no children living. Her sisters have been looking out for her. The patient's interpretation of this is that they are trying to steal from her.  Medical history: Hypothyroidism. Some chronic kidney problems. History of a bladder mass which she had not allowed to be worked up in the past.  Substance abuse history: Denies alcohol use currently or any past substance abuse history.  Past Psychiatric History: Patient denies any history of mental health problems and there is no past documentation of any mental health problems. I evaluated her in consultation in November 2016. At that time the question was not capacity but  agitation. Patient appeared to be agitated in part from possibly some cognitive problems but also stemming from some personality issues. Very low-dose risperidone was prescribed at that time. It doesn't look like there was any follow-up. No history of psychiatric hospitalization no history of suicide attempts  Risk to Self: Is patient at risk for suicide?: No Risk to Others:   Prior  Inpatient Therapy:   Prior Outpatient Therapy:    Past Medical History:  Past Medical History  Diagnosis Date  . Eczema   . Hypothyroidism   . WPW (Wolff-Parkinson-White syndrome)   . Chronic kidney disease   . Lower extremity edema   . Weakness of both legs     knees  . Complication of anesthesia     slow to wake up   . Cardiac arrest ([place]) 1990   . Pneumonia     hx of   . Headache     Past Surgical History  Procedure Laterality Date  . Abdominal hysterectomy    . Breast enhancement surgery Bilateral   . Wpw N/A   . Cataract extraction Bilateral   . Transurethral resection of bladder tumor N/A 12/28/2014    Procedure: TRANSURETHRAL RESECTION OF BLADDER TUMOR (TURBT);  Surgeon: Hollice Espy, MD;  Location: [facility] ORS;  Service: Urology;  Laterality: N/A;  . Cystoscopy w/ retrogrades Bilateral 12/28/2014    Procedure: CYSTOSCOPY WITH RETROGRADE PYELOGRAM;  Surgeon: Hollice Espy, MD;  Location: [facility] ORS;  Service: Urology;  Laterality: Bilateral;  . Heart mapping      Family History:  Family History  Problem Relation Age of Onset  . Diabetes Neg Hx    Family Psychiatric  History: Patient denies being aware of any family history of mental illness or substance abuse problems Social History:  History  Alcohol Use No     History  Drug Use No    Social History   Social History  . Marital Status: Single    Spouse Name: N/A  . Number of Children: N/A  . Years of Education: N/A   Social History Main Topics  . Smoking status: Never Smoker   . Smokeless tobacco: Never Used  . Alcohol Use: No  . Drug Use: No  . Sexual Activity: Not Asked   Other Topics Concern  . None   Social History Narrative   Additional Social History:                          Allergies:  No Known Allergies  Labs:  Results for orders placed or performed during the hospital encounter of 09/15/15 (from the past 48 hour(s))  Basic metabolic panel     Status: Abnormal   Collection  Time: 09/15/15  5:08 PM  Result Value Ref Range   Sodium 136 135 - 145 mmol/L    Comment: LYTES REPEATED AND VERIFIED   Potassium 3.7 3.5 - 5.1 mmol/L   Chloride 103 101 - 111 mmol/L   CO2 13 (L) 22 - 32 mmol/L   Glucose, Bld 158 (H) 65 - 99 mg/dL   BUN 20 6 - 20 mg/dL   Creatinine, Ser 1.30 (H) 0.44 - 1.00 mg/dL   Calcium 10.0 8.9 - 10.3 mg/dL   GFR calc non Af Amer 39 (L) >60 mL/min   GFR calc Af Amer 45 (L) >60 mL/min    Comment: (NOTE) The eGFR has been calculated using the CKD EPI equation. This calculation has not been validated in all clinical situations.  eGFR's persistently <60 mL/min signify possible Chronic Kidney Disease.    Anion gap 20 (H) 5 - 15  CBC     Status: Abnormal   Collection Time: 09/15/15  5:08 PM  Result Value Ref Range   WBC 12.9 (H) 3.6 - 11.0 K/uL   RBC 4.46 3.80 - 5.20 MIL/uL   Hemoglobin 13.3 12.0 - 16.0 g/dL   HCT 41.1 35.0 - 47.0 %   MCV 92.1 80.0 - 100.0 fL   MCH 29.9 26.0 - 34.0 pg   MCHC 32.5 32.0 - 36.0 g/dL   RDW 18.2 (H) 11.5 - 14.5 %   Platelets 234 150 - 440 K/uL  TSH     Status: Abnormal   Collection Time: 09/15/15  5:08 PM  Result Value Ref Range   TSH 0.047 (L) 0.350 - 4.500 uIU/mL  Urinalysis complete, with microscopic ([facility] only)     Status: Abnormal   Collection Time: 09/15/15  7:30 PM  Result Value Ref Range   Color, Urine AMBER (A) YELLOW   APPearance TURBID (A) CLEAR   Glucose, UA NEGATIVE NEGATIVE mg/dL   Bilirubin Urine NEGATIVE NEGATIVE   Ketones, ur 2+ (A) NEGATIVE mg/dL   Specific Gravity, Urine 1.015 1.005 - 1.030   Hgb urine dipstick 1+ (A) NEGATIVE   pH 6.0 5.0 - 8.0   Protein, ur 100 (A) NEGATIVE mg/dL   Nitrite NEGATIVE NEGATIVE   Leukocytes, UA 3+ (A) NEGATIVE   RBC / HPF 0-5 0 - 5 RBC/hpf   WBC, UA 6-30 0 - 5 WBC/hpf   Bacteria, UA RARE (A) NONE SEEN   Squamous Epithelial / LPF 0-5 (A) NONE SEEN   Mucous PRESENT    Hyaline Casts, UA PRESENT   Urine culture     Status: None (Preliminary result)    Collection Time: 09/15/15  7:30 PM  Result Value Ref Range   Specimen Description URINE, RANDOM    Special Requests NONE    Culture TOO YOUNG TO READ    Report Status PENDING   Basic metabolic panel     Status: Abnormal   Collection Time: 09/16/15  5:16 AM  Result Value Ref Range   Sodium 138 135 - 145 mmol/L   Potassium 3.3 (L) 3.5 - 5.1 mmol/L   Chloride 110 101 - 111 mmol/L   CO2 18 (L) 22 - 32 mmol/L   Glucose, Bld 81 65 - 99 mg/dL   BUN 18 6 - 20 mg/dL   Creatinine, Ser 0.85 0.44 - 1.00 mg/dL   Calcium 9.0 8.9 - 10.3 mg/dL   GFR calc non Af Amer >60 >60 mL/min   GFR calc Af Amer >60 >60 mL/min    Comment: (NOTE) The eGFR has been calculated using the CKD EPI equation. This calculation has not been validated in all clinical situations. eGFR's persistently <60 mL/min signify possible Chronic Kidney Disease.    Anion gap 10 5 - 15  CBC     Status: Abnormal   Collection Time: 09/16/15  5:16 AM  Result Value Ref Range   WBC 7.5 3.6 - 11.0 K/uL   RBC 3.54 (L) 3.80 - 5.20 MIL/uL   Hemoglobin 10.9 (L) 12.0 - 16.0 g/dL   HCT 32.3 (L) 35.0 - 47.0 %   MCV 91.1 80.0 - 100.0 fL   MCH 30.9 26.0 - 34.0 pg   MCHC 33.9 32.0 - 36.0 g/dL   RDW 17.9 (H) 11.5 - 14.5 %   Platelets 189 150 - 440 K/uL  Magnesium     Status: Abnormal   Collection Time: 09/16/15  5:16 AM  Result Value Ref Range   Magnesium 1.6 (L) 1.7 - 2.4 mg/dL    Current Facility-Administered Medications  Medication Dose Route Frequency Provider Last Rate Last Dose  . 0.9 %  sodium chloride infusion   Intravenous Continuous Lytle Butte, MD 100 mL/hr at 09/16/15 1042    . acetaminophen (TYLENOL) tablet 650 mg  650 mg Oral Q6H PRN Lytle Butte, MD       Or  . acetaminophen (TYLENOL) suppository 650 mg  650 mg Rectal Q6H PRN Lytle Butte, MD      . cefTRIAXone (ROCEPHIN) 1 g in dextrose 5 % 50 mL IVPB  1 g Intravenous Q24H Lytle Butte, MD      . heparin injection 5,000 Units  5,000 Units Subcutaneous 3 times per  day Lytle Butte, MD   5,000 Units at 09/16/15 3143  . levothyroxine (SYNTHROID, LEVOTHROID) tablet 150 mcg  150 mcg Oral QAC breakfast Lytle Butte, MD   150 mcg at 09/16/15 1043  . magnesium sulfate IVPB 2 g 50 mL  2 g Intravenous Once Max Sane, MD      . morphine 2 MG/ML injection 2 mg  2 mg Intravenous Q4H PRN Lytle Butte, MD      . ondansetron [facility]) tablet 4 mg  4 mg Oral Q6H PRN Lytle Butte, MD       Or  . ondansetron [facility]) injection 4 mg  4 mg Intravenous Q6H PRN Lytle Butte, MD      . oxyCODONE (Oxy IR/ROXICODONE) immediate release tablet 5 mg  5 mg Oral Q4H PRN Lytle Butte, MD      . Derrill Memo ON 09/17/2015] potassium chloride (K-DUR,KLOR-CON) CR tablet 10 mEq  10 mEq Oral Daily Fritzi Mandes, MD        Musculoskeletal: Strength & Muscle Tone: decreased Gait & Station: I did not see her walk. Please look at the PT consult for description of her stability Patient leans: N/A  Psychiatric Specialty Exam: Review of Systems  Constitutional: Negative.   HENT: Negative.   Eyes: Negative.   Respiratory: Negative.   Cardiovascular: Negative.   Gastrointestinal: Negative.   Musculoskeletal: Negative.   Skin: Negative.   Neurological: Negative.   Psychiatric/Behavioral: Negative for depression, suicidal ideas, hallucinations, memory loss and substance abuse. The patient is not nervous/anxious and does not have insomnia.     Blood pressure 134/47, pulse 108, temperature 98.1 F (36.7 C), temperature source Oral, resp. rate 18, height _0  (1.727 m), weight 75.297 kg (166 lb), SpO2 98 %.Body mass index is 25.25 kg/(m^2).  General Appearance: Casual  Eye Contact::  Fair  Speech:  Normal Rate  Volume:  Normal  Mood:  Anxious, Dysphoric and Irritable  Affect:  Labile  Thought Process:  Loose and Tangential  Orientation:  Full (Time, Place, and Person)  Thought Content:  Paranoid Ideation  Suicidal Thoughts:  No  Homicidal Thoughts:  No  Memory:  Immediate;    Fair Recent;   Fair Remote;   Fair  Judgement:  Other:  This is a difficult one to know without a specific context  Insight:  Shallow  Psychomotor Activity:  Decreased  Concentration:  Poor  Recall:  Poor  Fund of Knowledge:Fair  Language: Fair  Akathisia:  No  Handed:  Right  AIMS (if indicated):     Assets:  Desire for Improvement Financial  Resources/Insurance Housing Social Support  ADL's:  Intact  Cognition: Impaired,  Mild  Sleep:      Treatment Plan Summary: Medication management and Plan Patient's mental status appears to me to be pretty similar to what I saw back in November. She is not severely demented nor does she appear to be depressed. She is very paranoid about her sisters. I can presume that that is excessive and unrealistic but I don't know the exact facts. As far as determining capacity there is unfortunately just not enough information that I have right now to make a clear determination. I will be happy to follow-up. I will also repeat the order for low dose risperidone as needed for agitation. If a particular decision arises that needs to be made we can use that as a better benchmark for determining capacity. Until then I would have to assume that she continues to have capacity that is assumed for any adult.  Disposition: Patient does not meet criteria for psychiatric inpatient admission. Supportive therapy provided about ongoing stressors.  John Clapacs 09/16/2015 5:22 PM

## 2015-09-16 NOTE — Clinical Social Work Note (Signed)
CSW received consult for New SNF. Pt is being followed by [facility], [phone]). Per APS worker, pt went to [facility] at discharge from last admission, however pt signed herself out of SNF AMA. PT is recommending SNF. Psych consult is pending. Assessment to follow. CSW will continue to follow.   Dede Query, MSW, LCSW  Clinical Social Worker [phone]

## 2015-09-16 NOTE — Progress Notes (Signed)
[facility] Physicians - [place] at [facility]   PATIENT NAME: Marie Mullen    MR#:  [id number]  DATE OF BIRTH:  Aug 25, 1940  SUBJECTIVE:  CHIEF COMPLAINT:   Chief Complaint  Patient presents with  . Weakness  sitting in bed, eating breakfast. REVIEW OF SYSTEMS:  Review of Systems  Constitutional: Positive for malaise/fatigue. Negative for fever, weight loss and diaphoresis.  HENT: Negative for ear discharge, ear pain, hearing loss, nosebleeds, sore throat and tinnitus.   Eyes: Negative for blurred vision and pain.  Respiratory: Negative for cough, hemoptysis, shortness of breath and wheezing.   Cardiovascular: Negative for chest pain, palpitations, orthopnea and leg swelling.  Gastrointestinal: Negative for heartburn, nausea, vomiting, abdominal pain, diarrhea, constipation and blood in stool.  Genitourinary: Negative for dysuria, urgency and frequency.  Musculoskeletal: Negative for myalgias and back pain.  Skin: Negative for itching and rash.  Neurological: Positive for weakness. Negative for dizziness, tingling, tremors, focal weakness, seizures and headaches.  Psychiatric/Behavioral: Negative for depression. The patient is not nervous/anxious.    DRUG ALLERGIES:  No Known Allergies VITALS:  Blood pressure 130/58, pulse 102, temperature 97.3 F (36.3 C), temperature source Oral, resp. rate 18, height  (1.727 m), weight 75.297 kg (166 lb), SpO2 100 %. PHYSICAL EXAMINATION:  Physical Exam  Constitutional: She is oriented to person, place, and time and well-developed, well-nourished, and in no distress.  HENT:  Head: Normocephalic and atraumatic.  Eyes: Conjunctivae and EOM are normal. Pupils are equal, round, and reactive to light.  Neck: Normal range of motion. Neck supple. No tracheal deviation present. No thyromegaly present.  Cardiovascular: Normal rate, regular rhythm and normal heart sounds.   Pulmonary/Chest: Effort normal and breath sounds normal.  No respiratory distress. She has no wheezes. She exhibits no tenderness.  Abdominal: Soft. Bowel sounds are normal. She exhibits no distension. There is no tenderness.  Musculoskeletal: Normal range of motion.  Neurological: She is alert and oriented to person, place, and time. No cranial nerve deficit.  Skin: Skin is warm and dry. No rash noted.  Psychiatric: Mood and affect normal.   LABORATORY PANEL:   CBC  Recent Labs Lab 09/16/15 0516  WBC 7.5  HGB 10.9*  HCT 32.3*  PLT 189   ------------------------------------------------------------------------------------------------------------------ Chemistries   Recent Labs Lab 09/16/15 0516  NA 138  K 3.3*  CL 110  CO2 18*  GLUCOSE 81  BUN 18  CREATININE 0.85  CALCIUM 9.0  MG 1.6*   RADIOLOGY:  Dg Chest 1 View  09/15/2015  CLINICAL DATA:  Patient found down today.  Initial encounter. EXAM: CHEST 1 VIEW COMPARISON:  PA and lateral chest 07/06/2015 and single view of the chest 09/20/2014. FINDINGS: Chronic, mild blunting of left costophrenic angle could be due to a tiny effusion or scar. No right effusion is seen. The lungs are clear. Heart size is upper normal. No pneumothorax. Calcified breast implants noted. IMPRESSION: No acute disease. Electronically Signed   By: Drusilla Kanner M.D.   On: 09/15/2015 18:01   ASSESSMENT AND PLAN:  75 year old Caucasian female history of hypothyroidism unspecified presenting with altered mental status after being found the floor  1. Acute encephalopathy: Likely secondary to urinary tract infection, continue Ceftriaxone, await urine culture, continue IV fluid hydration, avoid further sedating agents, consult physical therapy and social work for likely placement  2. Hypothyroidism: continue Synthroid, normal TSH  3. Acute kidney injury: resolved with IV fluid hydration.  4. Hypokalemia: replete and recheck, replete Mg  5.  Decision making capacity: psych c/s per request by SW  * Venous  thromboembolism prophylactic: Heparin subcutaneous   All the records are reviewed and case discussed with Care Management/Social Worker. Management plans discussed with the patient, family and they are in agreement.  CODE STATUS: FULL CODE  TOTAL TIME TAKING CARE OF THIS PATIENT: 35 minutes.   More than 50% of the time was spent in counseling/coordination of care: YES  POSSIBLE D/C IN 2-3 DAYS, DEPENDING ON CLINICAL CONDITION.   [facility], [place] M.D on 09/16/2015 at 12:52 PM  Between 7am to 6pm - Pager - [phone]  After 6pm go to [email or web address] - password [email or web address]  [facility]  Office  [phone]  CC: Primary care physician; Lauro Regulus., MD  Note: This dictation was prepared with Dragon dictation along with smaller phrase technology. Any transcriptional errors that result from this process are unintentional.

## 2015-09-16 NOTE — BH Assessment (Addendum)
Informed On call Psych MD (Dr. Toni Amend) of the Consult.

## 2015-09-16 NOTE — Plan of Care (Signed)
Problem: Safety: Goal: Ability to remain free from injury will improve Outcome: Progressing No fall or injury. Bed alarm on  Problem: Skin Integrity: Goal: Risk for impaired skin integrity will decrease Outcome: Progressing intact

## 2015-09-16 NOTE — Evaluation (Signed)
Physical Therapy Evaluation Patient Details Name: Marie Mullen MRN: [id number] DOB: May 03, 1941 Today's Date: 09/16/2015   History of Present Illness  Pt recently here with UTI, now here with AMS, weakness and encephalopathy,  Clinical Impression  Pt is distracted and somewhat confused t/o the session, difficult to keep her on task.  She is able to get to sitting and needs only minimal assist to get to standing but on attempt at walking a few feet she looses control of her bladder and needed max assist to sit back in bed safely as she was impulsive and sat down too quickly.  Pt shows poor safety awareness and is unable to follow instructions well.     Follow Up Recommendations SNF    Equipment Recommendations  Rolling walker with 5" wheels    Recommendations for Other Services       Precautions / Restrictions Precautions Precautions: Fall Restrictions Weight Bearing Restrictions: No      Mobility  Bed Mobility Overal bed mobility: Needs Assistance Bed Mobility: Supine to Sit     Supine to sit: Min assist     General bed mobility comments: Pt able to get to EOB with only minimal intervention, lacked safety awareness t/o session  Transfers Overall transfer level: Needs assistance Equipment used: Rolling walker (2 wheeled) Transfers: Sit to/from Stand Sit to Stand: Mod assist;Min assist;Max assist         General transfer comment: Pt shows good effort in getting up and appeared to be able to maintain balance fairly well with only min assist, pt needed max assist to get back to sitting as she was uncontrolled and started sitting before square to the bed.   Ambulation/Gait Ambulation/Gait assistance: Mod assist Ambulation Distance (Feet): 5 Feet Assistive device: Rolling walker (2 wheeled)       General Gait Details: Pt took a few steps and lost control of her bladder.  She tried to get back to the bed but crashed down and needed assist to land on the bed.  She  showed very poor safety awareness and did not appear aware of her deficits in this regard.  Stairs            Wheelchair Mobility    Modified Rankin (Stroke Patients Only)       Balance                                             Pertinent Vitals/Pain Pain Assessment: No/denies pain    Home Living Family/patient expects to be discharged to:: Unsure Living Arrangements: Alone Available Help at Discharge: Family             Additional Comments: Pt unable to give very good history, was somewhat confused adn distracted t/o the session.     Prior Function Level of Independence: Independent with assistive device(s) (per pt report)               Hand Dominance        Extremity/Trunk Assessment   Upper Extremity Assessment: Generalized weakness           Lower Extremity Assessment: Generalized weakness         Communication   Communication: No difficulties (pt perseverating on family trying to take advantage of her)  Cognition Arousal/Alertness: Awake/alert Behavior During Therapy: Anxious Overall Cognitive Status: No family/caregiver present to determine baseline cognitive functioning  General Comments      Exercises        Assessment/Plan    PT Assessment Patient needs continued PT services  PT Diagnosis Difficulty walking;Generalized weakness   PT Problem List Decreased strength;Decreased activity tolerance;Decreased balance;Decreased cognition  PT Treatment Interventions DME instruction;Stair training;Gait training;Functional mobility training;Therapeutic activities;Therapeutic exercise;Balance training;Neuromuscular re-education   PT Goals (Current goals can be found in the Care Plan section) Acute Rehab PT Goals Patient Stated Goal: none stated PT Goal Formulation: With patient Time For Goal Achievement: 09/30/15 Potential to Achieve Goals: Fair    Frequency Min 2X/week   Barriers  to discharge        Co-evaluation               End of Session Equipment Utilized During Treatment: Gait belt Activity Tolerance: Patient tolerated treatment well Patient left: in bed;with call bell/phone within reach;with bed alarm set;with nursing/sitter in room Nurse Communication: Mobility status         Time: 1331-1350 PT Time Calculation (min) (ACUTE ONLY): 19 min   Charges:   PT Evaluation $PT Eval Low Complexity: 1 Procedure     PT G Codes:       Loran Senters, PT, DPT [phone]  Malachi Pro 09/16/2015, 4:35 PM

## 2015-09-16 NOTE — Progress Notes (Signed)
MEDICATION RELATED CONSULT NOTE - INITIAL   Pharmacy Consult for electrolyte monitoring and repletion Indication: hypokalemia, hypomagnesemia  No Known Allergies  Patient Measurements: Height:  (172.7 cm) Weight: 166 lb (75.297 kg) IBW/kg (Calculated) : 63.9  Vital Signs: Temp: 97.3 F (36.3 C) (01/27 1056) Temp Source: Oral (01/27 1056) BP: 130/58 mmHg (01/27 1056) Pulse Rate: 102 (01/27 1056) Intake/Output from previous day:   Intake/Output from this shift:    Labs:  Recent Labs  09/15/15 1708 09/16/15 0516  WBC 12.9* 7.5  HGB 13.3 10.9*  HCT 41.1 32.3*  PLT 234 189  CREATININE 1.30* 0.85  MG  --  1.6*   Estimated Creatinine Clearance: 56.8 mL/min (by C-G formula based on Cr of 0.85).  Microbiology: Recent Results (from the past 720 hour(s))  Urine culture     Status: None (Preliminary result)   Collection Time: 09/15/15  7:30 PM  Result Value Ref Range Status   Specimen Description URINE, RANDOM  Final   Special Requests NONE  Final   Culture TOO YOUNG TO READ  Final   Report Status PENDING  Incomplete   Assessment: Pharmacy consulted to monitor and replace electrolytes when needed in this 75 year old female.  K was low this morning at 3.3 Add-on Mg ordered and was low at 1.6  Goal of Therapy:  Electrolytes within normal limits  Plan:  Magnesium 2 g IV x1 dose  K-Dur 20 mEq x 1 dose already given this morning Patient was prescribed K-Dur 10 mEq PO daily PTA. Will resume home med.  Ordered K/Mg/Phos with AM labs tomorrow.   Thank you for allowing pharmacy to be a part of this patient's care.  Cindi Carbon, PharmD Clinical Pharmacist 09/16/2015,1:38 PM

## 2015-09-17 LAB — POTASSIUM
POTASSIUM: 3.1 mmol/L — AB (ref 3.5–5.1)
Potassium: 3.3 mmol/L — ABNORMAL LOW (ref 3.5–5.1)

## 2015-09-17 LAB — PHOSPHORUS: Phosphorus: 1.8 mg/dL — ABNORMAL LOW (ref 2.5–4.6)

## 2015-09-17 LAB — MAGNESIUM: MAGNESIUM: 1.9 mg/dL (ref 1.7–2.4)

## 2015-09-17 MED ORDER — POTASSIUM CHLORIDE CRYS ER 20 MEQ PO TBCR
20.0000 meq | EXTENDED_RELEASE_TABLET | Freq: Once | ORAL | Status: AC
  Administered 2015-09-17: 20 meq via ORAL
  Filled 2015-09-17: qty 1

## 2015-09-17 MED ORDER — POTASSIUM PHOSPHATE MONOBASIC 500 MG PO TABS
1000.0000 mg | ORAL_TABLET | ORAL | Status: DC
  Filled 2015-09-17 (×4): qty 2

## 2015-09-17 MED ORDER — K PHOS MONO-SOD PHOS DI & MONO 155-852-130 MG PO TABS
1000.0000 mg | ORAL_TABLET | ORAL | Status: AC
  Administered 2015-09-17 (×4): 1000 mg via ORAL
  Filled 2015-09-17 (×7): qty 4

## 2015-09-17 MED ORDER — SENNA 8.6 MG PO TABS
2.0000 | ORAL_TABLET | Freq: Every day | ORAL | Status: DC
  Administered 2015-09-17: 11:00:00 17.2 mg via ORAL
  Filled 2015-09-17: qty 2

## 2015-09-17 NOTE — Progress Notes (Signed)
MEDICATION RELATED CONSULT NOTE - Follow Up  Pharmacy Consult for electrolyte monitoring and repletion Indication: hypokalemia, hypomagnesemia  No Known Allergies  Patient Measurements: Height:  (172.7 cm) Weight: 166 lb (75.297 kg) IBW/kg (Calculated) : 63.9  Vital Signs: Temp: 98.5 F (36.9 C) (01/28 1416) Temp Source: Oral (01/28 1416) BP: 122/56 mmHg (01/28 1416) Pulse Rate: 83 (01/28 1416) Intake/Output from previous day: 01/27 0701 - 01/28 0700 In: 120 [P.O.:120] Out: -  Intake/Output from this shift: Total I/O In: 240 [P.O.:240] Out: -   Labs:  Recent Labs  09/15/15 1708 09/16/15 0516 09/17/15 0506  WBC 12.9* 7.5  --   HGB 13.3 10.9*  --   HCT 41.1 32.3*  --   PLT 234 189  --   CREATININE 1.30* 0.85  --   MG  --  1.6* 1.9  PHOS  --   --  1.8*   Estimated Creatinine Clearance: 56.8 mL/min (by C-G formula based on Cr of 0.85).  Microbiology: Recent Results (from the past 720 hour(s))  Urine culture     Status: None (Preliminary result)   Collection Time: 09/15/15  7:30 PM  Result Value Ref Range Status   Specimen Description URINE, RANDOM  Final   Special Requests NONE  Final   Culture   Final    >=100,000 COLONIES/mL GRAM NEGATIVE RODS IDENTIFICATION AND SUSCEPTIBILITIES TO FOLLOW    Report Status PENDING  Incomplete   Assessment: Pharmacy consulted to monitor and replace electrolytes when needed in this 75 year old female. Patient takes K-Dur 10 mEq PO daily PTA.  01/28 K was low this morning at 3.1 Phos was low at 1.8 Mg was normal at 1.9  Goal of Therapy:  Electrolytes within normal limits  Plan:  K=3.3 Will give KCL po once per protocol and recheck in the AM along with Mg and phos.  Ordered K/Mg/Phos with AM labs tomorrow.   Thank you for allowing pharmacy to be a part of this patient's care.  Olene Floss, PharmD Clinical Pharmacist 09/17/2015,6:58 PM

## 2015-09-17 NOTE — Progress Notes (Signed)
[facility] Physicians - [place] at [facility]   PATIENT NAME: Marie Mullen    MR#:  [id number]  DATE OF BIRTH:  [birth year]  SUBJECTIVE:   Patient sitting up in bed in no apparent distress no complaints presently.  REVIEW OF SYSTEMS:  Review of Systems  Constitutional: Negative for fever and chills.  HENT: Negative for congestion and tinnitus.   Eyes: Negative for blurred vision and double vision.  Respiratory: Negative for cough, shortness of breath and wheezing.   Cardiovascular: Negative for chest pain, orthopnea and PND.  Gastrointestinal: Negative for nausea, vomiting, abdominal pain and diarrhea.  Genitourinary: Negative for dysuria and hematuria.  Neurological: Negative for dizziness, sensory change and focal weakness.  All other systems reviewed and are negative.  DRUG ALLERGIES:  No Known Allergies VITALS:  Blood pressure 121/51, pulse 89, temperature 98.2 F (36.8 C), temperature source Oral, resp. rate 18, height  (1.727 m), weight 75.297 kg (166 lb), SpO2 94 %. PHYSICAL EXAMINATION:  Physical Exam  Constitutional: She is oriented to person, place, and time and well-developed, well-nourished, and in no distress.  HENT:  Head: Normocephalic and atraumatic.  Eyes: Conjunctivae and EOM are normal. Pupils are equal, round, and reactive to light.  Neck: Normal range of motion. Neck supple. No tracheal deviation present. No thyromegaly present.  Cardiovascular: Normal rate, regular rhythm and normal heart sounds.   Pulmonary/Chest: Effort normal and breath sounds normal. No respiratory distress. She has no wheezes. She exhibits no tenderness.  Abdominal: Soft. Bowel sounds are normal. She exhibits no distension. There is no tenderness.  Musculoskeletal: Normal range of motion.  Neurological: She is alert and oriented to person, place, and time. No cranial nerve deficit.  Skin: Skin is warm and dry. No rash noted.  Psychiatric: Mood and affect normal.    LABORATORY PANEL:   CBC  Recent Labs Lab 09/16/15 0516  WBC 7.5  HGB 10.9*  HCT 32.3*  PLT 189   ------------------------------------------------------------------------------------------------------------------ Chemistries   Recent Labs Lab 09/16/15 0516 09/17/15 0506  NA 138  --   K 3.3* 3.1*  CL 110  --   CO2 18*  --   GLUCOSE 81  --   BUN 18  --   CREATININE 0.85  --   CALCIUM 9.0  --   MG 1.6* 1.9   RADIOLOGY:  No results found. ASSESSMENT AND PLAN:  75 year old Caucasian female history of hypothyroidism unspecified presenting with altered mental status after being found the floor  #1. Acute encephalopathy: Metabolic in nature secondary to UTI. -Improving and we'll continue IV antibiotics. Seen by psychiatry and no acute reason for inpatient psychiatry presently.  #2 UTI - cont. IV Ceftriaxone.  Urine cul. Growing 100,000 col of Gram (-) rod but ID pending.   #3. Hypothyroidism: continue Synthroid, normal TSH  #4. Acute kidney injury - resolved w/ IV fluids.   #5. Hypokalemia: will cont. To replace and repeat in a.m. Tomorrow.   #6. Decision making capacity: seen by Psych and as per them they cannot assess presently if pt. Can make decisions for herself and will cont. To monitor.   Seen by PT and they recommend SNF and Social Work aware.    All the records are reviewed and case discussed with Care Management/Social Worker. Management plans discussed with the patient, family and they are in agreement.  CODE STATUS: FULL CODE  TOTAL TIME TAKING CARE OF THIS PATIENT: 25 minutes.   POSSIBLE D/C IN 1-2 DAYS, DEPENDING ON CLINICAL CONDITION.  Houston Siren M.D on 09/17/2015 at 1:55 PM  Between 7am to 6pm - Pager - [phone]  After 6pm go to [email or web address] - password [email or web address]  [facility]  Office  [phone]  CC: Primary care physician; Lauro Regulus., MD  Note: This dictation was prepared with Dragon dictation  along with smaller phrase technology. Any transcriptional errors that result from this process are unintentional.

## 2015-09-17 NOTE — Progress Notes (Signed)
MEDICATION RELATED CONSULT NOTE - Follow Up  Pharmacy Consult for electrolyte monitoring and repletion Indication: hypokalemia, hypomagnesemia  No Known Allergies  Patient Measurements: Height:  (172.7 cm) Weight: 166 lb (75.297 kg) IBW/kg (Calculated) : 63.9  Vital Signs: Temp: 98.2 F (36.8 C) (01/28 0519) Temp Source: Oral (01/28 0519) BP: 121/51 mmHg (01/28 0519) Pulse Rate: 89 (01/28 0519) Intake/Output from previous day: 01/27 0701 - 01/28 0700 In: 120 [P.O.:120] Out: -  Intake/Output from this shift:    Labs:  Recent Labs  09/15/15 1708 09/16/15 0516 09/17/15 0506  WBC 12.9* 7.5  --   HGB 13.3 10.9*  --   HCT 41.1 32.3*  --   PLT 234 189  --   CREATININE 1.30* 0.85  --   MG  --  1.6* 1.9  PHOS  --   --  1.8*   Estimated Creatinine Clearance: 56.8 mL/min (by C-G formula based on Cr of 0.85).  Microbiology: Recent Results (from the past 720 hour(s))  Urine culture     Status: None (Preliminary result)   Collection Time: 09/15/15  7:30 PM  Result Value Ref Range Status   Specimen Description URINE, RANDOM  Final   Special Requests NONE  Final   Culture TOO YOUNG TO READ  Final   Report Status PENDING  Incomplete   Assessment: Pharmacy consulted to monitor and replace electrolytes when needed in this 75 year old female. Patient takes K-Dur 10 mEq PO daily PTA.  01/28 K was low this morning at 3.1 Phos was low at 1.8 Mg was normal at 1.9  Goal of Therapy:  Electrolytes within normal limits  Plan:  Supplement K and Phos with KPhos 2 tabs q4hrs x4 and recheck K at 1800 and Phos with morning labs. Recheck Mag with morning labs  Ordered K/Mg/Phos with AM labs tomorrow.   Thank you for allowing pharmacy to be a part of this patient's care.  Cy Blamer, PharmD Clinical Pharmacist 09/17/2015,7:15 AM

## 2015-09-17 NOTE — Consult Note (Signed)
  Psychiatry: Follow-up psychiatric evaluation. Patient has no new complaints today. Continues to repeat her paranoid sounding thoughts about her sisters. Continues to insist that she is safe to go home. Denies suicidal ideation. Appears to be eating adequately. Still no reason to think that she does not have capacity to make decisions. Supportive counseling done no medicines prescribed.

## 2015-09-18 LAB — POTASSIUM: Potassium: 3.7 mmol/L (ref 3.5–5.1)

## 2015-09-18 LAB — BASIC METABOLIC PANEL
Anion gap: 4 — ABNORMAL LOW (ref 5–15)
BUN: 10 mg/dL (ref 6–20)
CHLORIDE: 114 mmol/L — AB (ref 101–111)
CO2: 27 mmol/L (ref 22–32)
CREATININE: 0.61 mg/dL (ref 0.44–1.00)
Calcium: 8.3 mg/dL — ABNORMAL LOW (ref 8.9–10.3)
GFR calc Af Amer: >60 mL/min (ref >60)
GFR calc non Af Amer: >60 mL/min (ref >60)
GLUCOSE: 107 mg/dL — AB (ref 65–99)
Potassium: 2.9 mmol/L — CL (ref 3.5–5.1)
Sodium: 145 mmol/L (ref 135–145)

## 2015-09-18 LAB — URINE CULTURE: Culture: >=100000

## 2015-09-18 LAB — MAGNESIUM: Magnesium: 1.6 mg/dL — ABNORMAL LOW (ref 1.7–2.4)

## 2015-09-18 LAB — PHOSPHORUS: Phosphorus: 3.4 mg/dL (ref 2.5–4.6)

## 2015-09-18 MED ORDER — POTASSIUM CHLORIDE CRYS ER 20 MEQ PO TBCR
20.0000 meq | EXTENDED_RELEASE_TABLET | Freq: Three times a day (TID) | ORAL | Status: DC
  Administered 2015-09-18 – 2015-09-19 (×6): 20 meq via ORAL
  Filled 2015-09-18 (×6): qty 1

## 2015-09-18 MED ORDER — POTASSIUM CHLORIDE 10 MEQ/100ML IV SOLN
10.0000 meq | INTRAVENOUS | Status: DC
  Administered 2015-09-18: 10 meq via INTRAVENOUS
  Filled 2015-09-18 (×4): qty 100

## 2015-09-18 MED ORDER — POTASSIUM CHLORIDE CRYS ER 20 MEQ PO TBCR
40.0000 meq | EXTENDED_RELEASE_TABLET | Freq: Every day | ORAL | Status: DC
  Administered 2015-09-18: 40 meq via ORAL
  Filled 2015-09-18: qty 2

## 2015-09-18 MED ORDER — MAGNESIUM SULFATE 2 GM/50ML IV SOLN
2.0000 g | Freq: Once | INTRAVENOUS | Status: AC
  Administered 2015-09-18: 2 g via INTRAVENOUS
  Filled 2015-09-18: qty 50

## 2015-09-18 MED ORDER — CEFUROXIME AXETIL 250 MG PO TABS
250.0000 mg | ORAL_TABLET | Freq: Two times a day (BID) | ORAL | Status: DC
  Administered 2015-09-18 – 2015-09-19 (×3): 250 mg via ORAL
  Filled 2015-09-18 (×6): qty 1

## 2015-09-18 MED ORDER — CEPHALEXIN 500 MG PO CAPS: 500.0000 mg | ORAL_CAPSULE | Freq: Two times a day (BID) | ORAL | Status: DC

## 2015-09-18 NOTE — Progress Notes (Signed)
MEDICATION RELATED CONSULT NOTE - Follow Up  Pharmacy Consult for electrolyte monitoring and repletion Indication: hypokalemia, hypomagnesemia  No Known Allergies  Patient Measurements: Height:  (172.7 cm) Weight: 166 lb (75.297 kg) IBW/kg (Calculated) : 63.9  Vital Signs: Temp: 98.2 F (36.8 C) (01/29 0513) Temp Source: Oral (01/29 0513) BP: 149/70 mmHg (01/29 0513) Pulse Rate: 93 (01/29 0513) Intake/Output from previous day: 01/28 0701 - 01/29 0700 In: 360 [P.O.:360] Out: -    Labs:  Recent Labs  09/15/15 1708 09/16/15 0516 09/17/15 0506 09/18/15 0614  WBC 12.9* 7.5  --   --   HGB 13.3 10.9*  --   --   HCT 41.1 32.3*  --   --   PLT 234 189  --   --   CREATININE 1.30* 0.85  --  0.61  MG  --  1.6* 1.9 1.6*  PHOS  --   --  1.8* 3.4   Estimated Creatinine Clearance: 60.4 mL/min (by C-G formula based on Cr of 0.61).  Microbiology: Recent Results (from the past 720 hour(s))  Urine culture     Status: None (Preliminary result)   Collection Time: 09/15/15  7:30 PM  Result Value Ref Range Status   Specimen Description URINE, RANDOM  Final   Special Requests NONE  Final   Culture   Final    >=100,000 COLONIES/mL GRAM NEGATIVE RODS IDENTIFICATION AND SUSCEPTIBILITIES TO FOLLOW    Report Status PENDING  Incomplete   Assessment: Pharmacy consulted to monitor and replace electrolytes when needed in this 75 year old female. Patient takes K-Dur 10 mEq PO daily PTA.  01/29  K+:2.9 Mg: 1.6 Phos: 3.4  Goal of Therapy:  Electrolytes within normal limits  Plan:  Potassium is currently being replaced with KCL 40 mEq PO X 1 and KCL 10 mEq X 4 IVPB.  Will replace magnesium with 2gm IV per protocol.   Will recheck potassium at 1300 today.    Cher Nakai, PharmD Pharmacy Resident  09/18/2015,7:31 AM

## 2015-09-18 NOTE — Progress Notes (Signed)
[facility] - [place] at [facility]   PATIENT NAME: Marie Mullen    MR#:  [id number]  DATE OF BIRTH:  [birth year]  SUBJECTIVE:   No acute events overnight.  Remains Hypokalemic.   REVIEW OF SYSTEMS:  Review of Systems  Constitutional: Negative for fever and chills.  HENT: Negative for congestion and tinnitus.   Eyes: Negative for blurred vision and double vision.  Respiratory: Negative for cough, shortness of breath and wheezing.   Cardiovascular: Negative for chest pain, orthopnea and PND.  Gastrointestinal: Negative for nausea, vomiting, abdominal pain and diarrhea.  Genitourinary: Negative for dysuria and hematuria.  Neurological: Negative for dizziness, sensory change and focal weakness.  All other systems reviewed and are negative.  DRUG ALLERGIES:  No Known Allergies VITALS:  Blood pressure 149/70, pulse 93, temperature 98.2 F (36.8 C), temperature source Oral, resp. rate 20, height  (1.727 m), weight 75.297 kg (166 lb), SpO2 96 %. PHYSICAL EXAMINATION:  Physical Exam  Constitutional: She is oriented to person, place, and time and well-developed, well-nourished, and in no distress.  HENT:  Head: Normocephalic and atraumatic.  Eyes: Conjunctivae and EOM are normal. Pupils are equal, round, and reactive to light.  Neck: Normal range of motion. Neck supple. No tracheal deviation present. No thyromegaly present.  Cardiovascular: Normal rate, regular rhythm and normal heart sounds.   Pulmonary/Chest: Effort normal and breath sounds normal. No respiratory distress. She has no wheezes. She exhibits no tenderness.  Abdominal: Soft. Bowel sounds are normal. She exhibits no distension. There is no tenderness.  Musculoskeletal: Normal range of motion.  Neurological: She is alert and oriented to person, place, and time. No cranial nerve deficit.  Skin: Skin is warm and dry. No rash noted.  Psychiatric: Mood and affect normal.   LABORATORY PANEL:    CBC  Recent Labs Lab 09/16/15 0516  WBC 7.5  HGB 10.9*  HCT 32.3*  PLT 189   ------------------------------------------------------------------------------------------------------------------ Chemistries   Recent Labs Lab 09/18/15 0614  NA 145  K 2.9*  CL 114*  CO2 27  GLUCOSE 107*  BUN 10  CREATININE 0.61  CALCIUM 8.3*  MG 1.6*   RADIOLOGY:  No results found. ASSESSMENT AND PLAN:  75 year old Caucasian female history of hypothyroidism unspecified presenting with altered mental status after being found the floor  #1. Acute encephalopathy: Metabolic in nature secondary to UTI. -Improving and switched from IV Ceftriaxone to Oral Ceftin today. Mental status improving.  - Seen by psychiatry and no acute reason for inpatient psychiatry presently.  #2 UTI - Urine culture + for Morganella Morganii.  - switched from IV Ceftriaxone to oral Ceftin today.    #3. Hypothyroidism: continue Synthroid, normal TSH  #4. Acute kidney injury - resolved w/ IV fluids.   #5. Hypokalemia: potassium today 2.9. Will increase oral potassium supplements and monitor.  - recheck potassium, Mg. In a.m. Tomorrow.   #6. Decision making capacity: seen by Psych and as per them they cannot assess presently if pt. Can make decisions for herself and will cont. To monitor.   Seen by PT and they recommend SNF and Social Work aware.    All the records are reviewed and case discussed with Care Management/Social Worker. Management plans discussed with the patient, family and they are in agreement.  CODE STATUS: FULL CODE  TOTAL TIME TAKING CARE OF THIS PATIENT: 25 minutes.   POSSIBLE D/C IN 1-2 DAYS, DEPENDING ON CLINICAL CONDITION.   Houston Siren M.D on 09/18/2015 at 12:41 PM  Between 7am to 6pm - Pager - [phone]  After 6pm go to [email or web address] - password [email or web address]  [facility]  Office  [phone]  CC: Primary care physician; Lauro Regulus., MD  Note:  This dictation was prepared with Dragon dictation along with smaller phrase technology. Any transcriptional errors that result from this process are unintentional.

## 2015-09-18 NOTE — Progress Notes (Signed)
MEDICATION RELATED CONSULT NOTE - Follow Up  Pharmacy Consult for electrolyte monitoring and repletion Indication: hypokalemia, hypomagnesemia  No Known Allergies  Patient Measurements: Height:  (172.7 cm) Weight: 166 lb (75.297 kg) IBW/kg (Calculated) : 63.9  Vital Signs: Temp: 97.5 F (36.4 C) (01/29 1443) Temp Source: Oral (01/29 1443) BP: 117/62 mmHg (01/29 1443) Pulse Rate: 79 (01/29 1443) Intake/Output from previous day: 01/28 0701 - 01/29 0700 In: 360 [P.O.:360] Out: -    Labs:  Recent Labs  09/15/15 1708 09/16/15 0516 09/17/15 0506 09/18/15 0614  WBC 12.9* 7.5  --   --   HGB 13.3 10.9*  --   --   HCT 41.1 32.3*  --   --   PLT 234 189  --   --   CREATININE 1.30* 0.85  --  0.61  MG  --  1.6* 1.9 1.6*  PHOS  --   --  1.8* 3.4   Estimated Creatinine Clearance: 60.4 mL/min (by C-G formula based on Cr of 0.61).  Microbiology: Recent Results (from the past 720 hour(s))  Urine culture     Status: None   Collection Time: 09/15/15  7:30 PM  Result Value Ref Range Status   Specimen Description URINE, RANDOM  Final   Special Requests NONE  Final   Culture   Final    >=100,000 COLONIES/mL MORGANELLA MORGANII WITH MIXED BACTERIAL ORGANISMS    Report Status 09/18/2015 FINAL  Final   Organism ID, Bacteria MORGANELLA MORGANII  Final      Susceptibility   Morganella morganii - MIC*    AMPICILLIN Value in next row Resistant      RESISTANT>=32    CEFAZOLIN Value in next row Resistant      RESISTANT>=64    CEFTRIAXONE Value in next row Sensitive      SENSITIVE<=1    CIPROFLOXACIN Value in next row Sensitive      SENSITIVE<=0.25    GENTAMICIN Value in next row Sensitive      SENSITIVE<=1    IMIPENEM Value in next row Sensitive      SENSITIVE1    NITROFURANTOIN Value in next row Resistant      [id number]    TRIMETH/SULFA Value in next row Sensitive      SENSITIVE<=20    AMPICILLIN/SULBACTAM Value in next row Intermediate      INTERMEDIATE16   PIP/TAZO Value in next row Sensitive      SENSITIVE<=4    * >=100,000 COLONIES/mL MORGANELLA MORGANII   Assessment: Pharmacy consulted to monitor and replace electrolytes when needed in this 75 year old female. Patient takes K-Dur 10 mEq PO daily PTA.  01/29  K+:2.9 Mg: 1.6 Phos: 3.4  01/29 : K+ 3.7  Goal of Therapy:  Electrolytes within normal limits  Plan:  Potassium level WNL at 3.7.  Pharmacy will follow up on electrolytes in AM and replace as needed.    Cher Nakai, PharmD Pharmacy Resident  09/18/2015,3:44 PM

## 2015-09-18 NOTE — Consult Note (Signed)
  Psychiatry: Follow-up for 75 year old woman with concerns about capacity. Patient interviewed. Chart reviewed. Patient has no new complaints today. States that her mood is fine. Denies any sense of feeling confused. Patient continues to talk about how her sisters are trying to cheat her out of her money. Peeler is to be a little paranoid about it. At the same time she is alert and oriented and not obviously demented. No evidence of acute dangerousness. No real change from yesterday. I will sign off at this point. Please reconsult if a new situation arises or new questions arise.

## 2015-09-18 NOTE — Plan of Care (Signed)
Problem: Physical Regulation: Goal: Ability to maintain clinical measurements within normal limits will improve Outcome: Progressing Potassium 2.9 in am. Potassium supplements given. Potassium up to 3.7.  Magnesium 1.6. Magnesium IV given.

## 2015-09-18 NOTE — Clinical Social Work Note (Signed)
CSW consulted for New SNF. Pt assessed and full assessment to follow. Pt would like to return home with home health. CSW will continue to follow.  Dede Query, MSW, LCSW Clinical Social Worker [phone]

## 2015-09-19 ENCOUNTER — Encounter
Admission: RE | Admit: 2015-09-19 | Discharge: 2015-09-19 | Disposition: A | Discharge status: Home / Self Care | Payer: Medicare Other | Source: Ambulatory Visit | Attending: Internal Medicine | Admitting: Internal Medicine

## 2015-09-19 LAB — MAGNESIUM: Magnesium: 1.8 mg/dL (ref 1.7–2.4)

## 2015-09-19 LAB — CBC
HCT: 32 % — ABNORMAL LOW (ref 35.0–47.0)
Hemoglobin: 10.7 g/dL — ABNORMAL LOW (ref 12.0–16.0)
MCH: 31.1 pg (ref 26.0–34.0)
MCHC: 33.4 g/dL (ref 32.0–36.0)
MCV: 93.2 fL (ref 80.0–100.0)
PLATELETS: 161 10*3/uL (ref 150–440)
RBC: 3.43 MIL/uL — ABNORMAL LOW (ref 3.80–5.20)
RDW: 18.5 % — AB (ref 11.5–14.5)
WBC: 4.2 10*3/uL (ref 3.6–11.0)

## 2015-09-19 LAB — POTASSIUM: POTASSIUM: 3.9 mmol/L (ref 3.5–5.1)

## 2015-09-19 MED ORDER — CEFUROXIME AXETIL 250 MG PO TABS: 250.0000 mg | ORAL_TABLET | Freq: Two times a day (BID) | ORAL | Status: DC

## 2015-09-19 MED ORDER — CEFDINIR 300 MG PO CAPS: 300.0000 mg | ORAL_CAPSULE | Freq: Two times a day (BID) | ORAL | Status: DC

## 2015-09-19 NOTE — NC FL2 (Signed)
[place] MEDICAID FL2 LEVEL OF CARE SCREENING TOOL     IDENTIFICATION  Patient Name: Marie Mullen Birthdate: Sep 28, 1940 Sex: female Admission Date (Current Location): 09/15/2015  [place] Number:  Chiropodist and Address:  [facility], [address], [place], [place] [address]      Provider Number: [id number]  Attending Physician Name and Address:  Houston Siren, MD  Relative Name and Phone Number:       Current Level of Care: SNF Recommended Level of Care: Skilled Nursing Facility Prior Approval Number:    Date Approved/Denied:   PASRR Number: [id number]  Discharge Plan: SNF    Current Diagnoses: Patient Active Problem List   Diagnosis Date Noted  . Encephalopathy 09/15/2015  . Urinary tract infection 09/15/2015  . CKD (chronic kidney disease) stage 2, GFR 60-89 ml/min 07/08/2015  . Hypothyroidism 07/08/2015  . Bladder mass 07/08/2015  . Dementia with behavioral disturbance     Orientation RESPIRATION BLADDER Height & Weight     Self, Time, Situation, Place  Normal Continent Weight: 166 lb (75.297 kg) Height:   (172.7 cm)  BEHAVIORAL SYMPTOMS/MOOD NEUROLOGICAL BOWEL NUTRITION STATUS      Incontinent Diet (Heart Healthy, Thin Liquids)  AMBULATORY STATUS COMMUNICATION OF NEEDS Skin   Limited Assist Verbally Normal                       Personal Care Assistance Level of Assistance  Bathing, Feeding, Dressing Bathing Assistance: Limited assistance Feeding assistance: Limited assistance Dressing Assistance: Limited assistance     Functional Limitations Info  Sight, Hearing, Speech Sight Info: Adequate Hearing Info: Adequate Speech Info: Adequate    SPECIAL CARE FACTORS FREQUENCY  PT (By licensed PT)     PT Frequency: 5              Contractures      Additional Factors Info  Code Status, Allergies Code Status Info: Full Code Allergies Info: No known allergies            Current Medications (09/19/2015):  This is the current hospital active medication list Current Facility-Administered Medications  Medication Dose Route Frequency Provider Last Rate Last Dose  . acetaminophen (TYLENOL) tablet 650 mg  650 mg Oral Q6H PRN Wyatt Haste, MD       Or  . acetaminophen (TYLENOL) suppository 650 mg  650 mg Rectal Q6H PRN Wyatt Haste, MD      . cefUROXime (CEFTIN) tablet 250 mg  250 mg Oral BID WC Houston Siren, MD   250 mg at 09/19/15 0755  . heparin injection 5,000 Units  5,000 Units Subcutaneous 3 times per day Wyatt Haste, MD   5,000 Units at 09/19/15 0515  . levothyroxine (SYNTHROID, LEVOTHROID) tablet 150 mcg  150 mcg Oral QAC breakfast Wyatt Haste, MD   150 mcg at 09/19/15 0755  . morphine 2 MG/ML injection 2 mg  2 mg Intravenous Q4H PRN Wyatt Haste, MD      . ondansetron [facility]) tablet 4 mg  4 mg Oral Q6H PRN Wyatt Haste, MD       Or  . ondansetron [facility]) injection 4 mg  4 mg Intravenous Q6H PRN Wyatt Haste, MD      . oxyCODONE (Oxy IR/ROXICODONE) immediate release tablet 5 mg  5 mg Oral Q4H PRN Wyatt Haste, MD      . potassium chloride SA (K-DUR,KLOR-CON) CR tablet  20 mEq  20 mEq Oral TID Houston Siren, MD   20 mEq at 09/19/15 0755  . risperiDONE (RISPERDAL) tablet 0.25 mg  0.25 mg Oral Q6H PRN Audery Amel, MD         Discharge Medications: Please see discharge summary for a list of discharge medications.  Relevant Imaging Results:  Relevant Lab Results:   Additional Information SSN:  [id number]  Dede Query, LCSW

## 2015-09-19 NOTE — Discharge Summary (Addendum)
[facility] - [place] at [facility]   PATIENT NAME: Marie Mullen    MR#:  [id number]  DATE OF BIRTH:  16-Jun-1941  DATE OF ADMISSION:  09/15/2015 ADMITTING PHYSICIAN: Wyatt Haste, MD  DATE OF DISCHARGE: 09/19/2015  PRIMARY CARE PHYSICIAN: Lauro Regulus., MD    ADMISSION DIAGNOSIS:  Ketosis [E87.2] Tachycardia [R00.0] Acute cystitis without hematuria [N30.00]  DISCHARGE DIAGNOSIS:  Principal Problem:   Dementia with behavioral disturbance Active Problems:   Encephalopathy   Urinary tract infection   SECONDARY DIAGNOSIS:   Past Medical History  Diagnosis Date  . Eczema   . Hypothyroidism   . WPW (Wolff-Parkinson-White syndrome)   . Chronic kidney disease   . Lower extremity edema   . Weakness of both legs     knees  . Complication of anesthesia     slow to wake up   . Cardiac arrest (HCC) 1990   . Pneumonia     hx of   . Headache     HOSPITAL COURSE:   75 year old Caucasian female history of hypothyroidism unspecified presenting with altered mental status after being found the floor.  #1. Acute encephalopathy: this was Metabolic in nature secondary to UTI. -Patient was treated with IV antibiotics on IV ceftriaxone and improved and her mental status is not back to baseline. -Patient was also seen by psychiatry given her a mental status change and they did not think the patient had any need for acute inpatient psychiatric care.  #2 UTI - Urine culture + for Morganella Morganii.  - Treated with IV ceftriaxone on the hospital and now being discharged on oral Ceftin. Currently afebrile and asymptomatic.  #3. Hypothyroidism: She will continue her Synthroid   #4. Acute kidney injury - this was secondary to the urinary tract infection and also dehydration. Slight-patient was hydrated with IV fluids and given IV antibiotics for her urinary tract infection and her renal function is now back to baseline.  #5. Hypokalemia: This has since  then been supplemented and resolved. Patient's potassium and magnesium levels are normal upon discharge.  #6. Decision making capacity: seen by Psych and as per them patient does have the capacity to make decisions for herself and does not require any inpatient psychiatric care.  Patient was seen by physical therapy and recommended short-term rehabilitation which is where she is being discharged presently.  DISCHARGE CONDITIONS:   Stable  CONSULTS OBTAINED:  Treatment Team:  Wyatt Haste, MD Audery Amel, MD  DRUG ALLERGIES:  No Known Allergies  DISCHARGE MEDICATIONS:   Current Discharge Medication List    START taking these medications   Details  cefdinir (OMNICEF) 300 MG capsule Take 1 capsule (300 mg total) by mouth 2 (two) times daily. Qty: 10 capsule, Refills: 0      CONTINUE these medications which have NOT CHANGED   Details  levothyroxine (SYNTHROID, LEVOTHROID) 150 MCG tablet Take 150 mcg by mouth daily before breakfast.    potassium chloride (K-DUR) 10 MEQ tablet Take 10 mEq by mouth daily.         DISCHARGE INSTRUCTIONS:   DIET:  Regular diet  DISCHARGE CONDITION:  Stable  ACTIVITY:  Activity as tolerated  OXYGEN:  Home Oxygen: No.   Oxygen Delivery: room air  DISCHARGE LOCATION:  home   If you experience worsening of your admission symptoms, develop shortness of breath, life threatening emergency, suicidal or homicidal thoughts you must seek medical attention immediately by calling 911 or calling your MD immediately  if symptoms less severe.  You Must read complete instructions/literature along with all the possible adverse reactions/side effects for all the Medicines you take and that have been prescribed to you. Take any new Medicines after you have completely understood and accpet all the possible adverse reactions/side effects.   Please note  You were cared for by a hospitalist during your hospital stay. If you have any questions about  your discharge medications or the care you received while you were in the hospital after you are discharged, you can call the unit and asked to speak with the hospitalist on call if the hospitalist that took care of you is not available. Once you are discharged, your primary care physician will handle any further medical issues. Please note that NO REFILLS for any discharge medications will be authorized once you are discharged, as it is imperative that you return to your primary care physician (or establish a relationship with a primary care physician if you do not have one) for your aftercare needs so that they can reassess your need for medications and monitor your lab values.     Today   No complaints presently. Afebrile, hemodynamically stable.  VITAL SIGNS:  Blood pressure 131/72, pulse 82, temperature 98.5 F (36.9 C), temperature source Oral, resp. rate 18, height  (1.727 m), weight 75.297 kg (166 lb), SpO2 96 %.  I/O:    Intake/Output Summary (Last 24 hours) at 09/19/15 1352 Last data filed at 09/18/15 1700  Gross per 24 hour  Intake    360 ml  Output      0 ml  Net    360 ml    PHYSICAL EXAMINATION:  GENERAL:  75 y.o.-year-old patient lying in the bed in no acute distress.  EYES: Pupils equal, round, reactive to light and accommodation. No scleral icterus. Extraocular muscles intact.  HEENT: Head atraumatic, normocephalic. Oropharynx and nasopharynx clear.  NECK:  Supple, no jugular venous distention. No thyroid enlargement, no tenderness.  LUNGS: Normal breath sounds bilaterally, no wheezing, rales,rhonchi. No use of accessory muscles of respiration.  CARDIOVASCULAR: S1, S2 normal. No murmurs, rubs, or gallops.  ABDOMEN: Soft, non-tender, non-distended. Bowel sounds present. No organomegaly or mass.  EXTREMITIES: No pedal edema, cyanosis, or clubbing.  NEUROLOGIC: Cranial nerves II through XII are intact. No focal motor or sensory defecits b/l.  PSYCHIATRIC: The  patient is alert and oriented x 3. Good affect.  SKIN: No obvious rash, lesion, or ulcer.   DATA REVIEW:   CBC  Recent Labs Lab 09/19/15 0525  WBC 4.2  HGB 10.7*  HCT 32.0*  PLT 161    Chemistries   Recent Labs Lab 09/18/15 0614  09/19/15 0525  NA 145  --   --   K 2.9*  < > 3.9  CL 114*  --   --   CO2 27  --   --   GLUCOSE 107*  --   --   BUN 10  --   --   CREATININE 0.61  --   --   CALCIUM 8.3*  --   --   MG 1.6*  --  1.8  < > = values in this interval not displayed.  Cardiac Enzymes No results for input(s): TROPONINI in the last 168 hours.  Microbiology Results  Results for orders placed or performed during the hospital encounter of 09/15/15  Urine culture     Status: None   Collection Time: 09/15/15  7:30 PM  Result Value Ref Range Status  Specimen Description URINE, RANDOM  Final   Special Requests NONE  Final   Culture   Final    >=100,000 COLONIES/mL MORGANELLA MORGANII WITH MIXED BACTERIAL ORGANISMS    Report Status 09/18/2015 FINAL  Final   Organism ID, Bacteria MORGANELLA MORGANII  Final      Susceptibility   Morganella morganii - MIC*    AMPICILLIN Value in next row Resistant      RESISTANT>=32    CEFAZOLIN Value in next row Resistant      RESISTANT>=64    CEFTRIAXONE Value in next row Sensitive      SENSITIVE<=1    CIPROFLOXACIN Value in next row Sensitive      SENSITIVE<=0.25    GENTAMICIN Value in next row Sensitive      SENSITIVE<=1    IMIPENEM Value in next row Sensitive      SENSITIVE1    NITROFURANTOIN Value in next row Resistant      [id number]    TRIMETH/SULFA Value in next row Sensitive      SENSITIVE<=20    AMPICILLIN/SULBACTAM Value in next row Intermediate      INTERMEDIATE16    PIP/TAZO Value in next row Sensitive      SENSITIVE<=4    * >=100,000 COLONIES/mL MORGANELLA MORGANII    RADIOLOGY:  No results found.    Management plans discussed with the patient, family and they are in agreement.  CODE STATUS:      Code Status Orders        Start     Ordered   09/15/15 2108  Full code   Continuous     09/15/15 2107    Code Status History    Date Active Date Inactive Code Status Order ID Comments User Context   07/06/2015  5:32 PM 07/09/2015  6:20 PM Full Code [id number]  Ramonita Lab, MD Inpatient      TOTAL TIME TAKING CARE OF THIS PATIENT: 40 minutes.    Houston Siren M.D on 09/19/2015 at 1:52 PM  Between 7am to 6pm - Pager - [phone]  After 6pm go to [email or web address] - password [email or web address] [facility]  [place] [facility]  Office  [phone]  CC: Primary care physician; Lauro Regulus., MD

## 2015-09-19 NOTE — Progress Notes (Signed)
MEDICATION RELATED CONSULT NOTE - Follow Up  Pharmacy Consult for electrolyte monitoring and repletion Indication: hypokalemia, hypomagnesemia  No Known Allergies  Vital Signs: Temp: 98.5 F (36.9 C) (01/30 0514) Temp Source: Oral (01/30 0514) BP: 131/72 mmHg (01/30 0514) Pulse Rate: 82 (01/30 0514)   Intake/Output from previous day: 01/29 0701 - 01/30 0700 In: 600 [P.O.:600] Out: -   Labs:  Recent Labs  09/17/15 0506 09/18/15 0614 09/19/15 0525  WBC  --   --  4.2  HGB  --   --  10.7*  HCT  --   --  32.0*  PLT  --   --  161  CREATININE  --  0.61  --   MG 1.9 1.6* 1.8  PHOS 1.8* 3.4  --    Estimated Creatinine Clearance: 60.4 mL/min (by C-G formula based on Cr of 0.61).  Assessment: Pharmacy consulted to monitor and replace electrolytes when needed in this 75 year old female. Patient takes K-Dur 10 mEq PO daily PTA.  Patient's potassium has been low for for the past few days and patient is currently prescribed K-Dur 20 mEq q8h.   01/30 AM labs are within normal limits K+:3.9 Mg: 1.8  Goal of Therapy:  Electrolytes within normal limits  Plan:  Electrolytes are within normal limits today. Continue current orders and pharmacy will check electrolytes tomorrow morning.   Keturah Barre, PharmD Clinical Pharmacist 09/19/2015,7:25 AM

## 2015-09-19 NOTE — Progress Notes (Signed)
Pt d/c at 2100 via EMS.

## 2015-09-19 NOTE — Progress Notes (Signed)
Checked to see if pre-authorization would be needed for non-emergent EMS transport. Per [employer] benefits obtained online through [employer], patient has an [employer] Medicare Complete Choice PPO policy.  Medicare PPO plans do not require pre-auth for non-emergent ground transports using service codes A0426 or A0428.   

## 2015-09-19 NOTE — Plan of Care (Signed)
Called report to  [place] at [place]. Will be going to Room [address] on [address].  Pt had no c/o pain.  Some incontinence but got up to [facility].  Continues to be treated for UTI w/po abx. Pt is A&O x4 but repeats story focused  on family issues. EMS called.

## 2015-09-19 NOTE — Care Management Important Message (Signed)
Important Message  Patient Details  Name: Marie Mullen MRN: [id number] Date of Birth: May 21, 1941   Medicare Important Message Given:  Yes    Olegario Messier A Judieth Mckown 09/19/2015, 11:24 AM

## 2015-09-19 NOTE — Care Management (Signed)
Spoke with Ms. Parlett at the bedside. Discussed going home with home health services in place. States she can't go home. There is no one in the home. Would like to go to short term rehabilitation. Clinical Social Worker updated. Gwenette Greet RN MSN CCM Care Management [phone]

## 2015-09-19 NOTE — Consult Note (Signed)
  Psychiatry: Spoke with social work this morning. Asked that I Place a sussinct note about this patient's capacity. Based on my evaluation of this patient and the current circumstances I do not have enough evidence to conclude that she lacks capacity to make decisions for herself. Therefore I must conclude that she should be treated as though she does have capacity .

## 2015-09-20 NOTE — Clinical Social Work Note (Signed)
Clinical Social Work Assessment  Patient Details  Name: Marie Mullen MRN: [id number] Date of Birth: [birth year]  Date of referral:  09/18/15               Reason for consult:  Facility Placement, Other (Comment Required) (APS)                Permission sought to share information with:  Other ([place]) Permission granted to share information::  Yes, Verbal Permission Granted  Name::     Marie Mullen [place] DSS APS [phone])   Housing/Transportation Living arrangements for the past 2 months:  [place] of Information:  Patient, Other (Comment Required) (DSS) Patient Interpreter Needed:  None Criminal Activity/Legal Involvement Pertinent to Current Situation/Hospitalization:  No - Comment as needed Significant Relationships:  Friend Lives with:  Self Do you feel safe going back to the place where you live?  Yes Need for family participation in patient care:  No ([place]) (pt declined to have family involved. )  Care giving concerns:  Pt lives alone and refusing SNF at time of assessment. Pt has APS following for self-neglect.   Social Worker assessment / plan:  CSW met with pt to address consult. CSW introduced herself and explained role of social work. PT is recommending SNF, however pt is declining. Pt is being followed by [place] [facility] APS. Pt does not want sisters involved due to money being taken out of account. APS is aware of family matters concerning pt and family. Pt is fixated on family concerns. Psych did not deem pt to lack capacity. CSW will update RNCM of discharge plan, as well as APS. Pt is open to home health services. CSW will continue to follow.    Employment status:  Retired Nurse, adult PT Recommendations:  [place] / Referral to community resources:  [place]  Patient/Family's Response to care:  Pt was appreciative of CSW support.    Patient/Family's Understanding of and Emotional Response to Diagnosis, Current Treatment, and Prognosis:  Pt is declining SNF placement.   Emotional Assessment Appearance:  Appears older than stated age Attitude/Demeanor/Rapport:  Guarded Affect (typically observed):  Anxious, Pleasant Orientation:  Oriented to Self, Oriented to Place, Oriented to  Time, Oriented to Situation Alcohol / Substance use:  Never Used Psych involvement (Current and /or in the community):  Yes (Comment) (Capacity assessment requested)  Discharge Needs  Concerns to be addressed:  Patient refuses services, Cognitive Concerns Readmission within the last 30 days:  No Current discharge risk:  Inadequate Financial Supports, Lack of support system, Lives alone Barriers to Discharge:  Continued Medical Work up, Family Issues   Darden Dates, LCSW 09/20/2015, 9:58 PM

## 2015-09-20 NOTE — Clinical Social Work Note (Signed)
Pt is ready for dishcarge today. Pt initiately declined SNF, however changed her mind about STR. CSW initiated SNF search. CSW provided bed offers. [facility] was chosen. CSW secured bed at facility for today. CSW updated [facility] DSS APS. CSW also sent Psych notes to DSS, per request. RN to call report and EMS will provide transportation. CSW is signing off as not further needs identified.   Dede Query, MSW, LCSW  Clinical Social Worker  [phone]

## 2015-09-20 NOTE — Clinical Social Work Placement (Signed)
   CLINICAL SOCIAL WORK PLACEMENT  NOTE  Date:  09/20/2015  Patient Details  Name: Marie Mullen MRN: [id number] Date of Birth: [birth year]  Clinical Social Work is seeking post-discharge placement for this patient at the Skilled  Nursing Facility level of care (*CSW will initial, date and re-position this form in  chart as items are completed):  Yes   Patient/family provided with [place] [facility] Clinical Social Work Department's list of facilities offering this level of care within the geographic area requested by the patient (or if unable, by the patient's family).  Yes   Patient/family informed of their freedom to choose among providers that offer the needed level of care, that participate in Medicare, Medicaid or managed care program needed by the patient, have an available bed and are willing to accept the patient.  Yes   Patient/family informed of [facility]'s ownership interest in [facility] and [facility], as well as of the fact that they are under no obligation to receive care at these facilities.  PASRR submitted to EDS on       PASRR number received on       Existing PASRR number confirmed on 09/19/15     FL2 transmitted to all facilities in geographic area requested by pt/family on 09/19/15     FL2 transmitted to all facilities within larger geographic area on       Patient informed that his/her managed care company has contracts with or will negotiate with certain facilities, including the following:        Yes   Patient/family informed of bed offers received.  Patient chooses bed at [facility]     Physician recommends and patient chooses bed at  [facility])    Patient to be transferred to [facility] on 09/19/15.  Patient to be transferred to facility by [facility]     Patient family notified on 09/19/15 of transfer.  Name of family member notified:  Declined by patient     PHYSICIAN       Additional Comment:     _______________________________________________ Dede Query, LCSW 09/20/2015, 9:52 PM

## 2015-09-21 ENCOUNTER — Encounter
Admission: RE | Admit: 2015-09-21 | Discharge: 2015-09-21 | Disposition: A | Discharge status: Home / Self Care | Payer: Medicare Other | Source: Ambulatory Visit | Attending: Internal Medicine | Admitting: Internal Medicine

## 2015-09-21 DIAGNOSIS — R509 Fever, unspecified: Secondary | ICD-10-CM | POA: Insufficient documentation

## 2015-09-21 DIAGNOSIS — R410 Disorientation, unspecified: Secondary | ICD-10-CM | POA: Insufficient documentation

## 2015-09-21 DIAGNOSIS — R5383 Other fatigue: Secondary | ICD-10-CM | POA: Insufficient documentation

## 2015-09-21 DIAGNOSIS — R8290 Unspecified abnormal findings in urine: Secondary | ICD-10-CM | POA: Insufficient documentation

## 2015-09-22 DIAGNOSIS — R5383 Other fatigue: Secondary | ICD-10-CM | POA: Diagnosis not present

## 2015-09-22 DIAGNOSIS — R410 Disorientation, unspecified: Secondary | ICD-10-CM | POA: Diagnosis present

## 2015-09-22 DIAGNOSIS — R8290 Unspecified abnormal findings in urine: Secondary | ICD-10-CM | POA: Diagnosis not present

## 2015-09-22 DIAGNOSIS — R509 Fever, unspecified: Secondary | ICD-10-CM | POA: Diagnosis not present

## 2015-09-22 LAB — CBC WITH DIFFERENTIAL/PLATELET
BASOS ABS: 0 10*3/uL (ref 0–0.1)
BASOS PCT: 1 %
EOS ABS: 0.1 10*3/uL (ref 0–0.7)
Eosinophils Relative: 3 %
HCT: 35.1 % (ref 35.0–47.0)
HEMOGLOBIN: 11.5 g/dL — AB (ref 12.0–16.0)
Lymphocytes Relative: 20 %
Lymphs Abs: 0.9 10*3/uL — ABNORMAL LOW (ref 1.0–3.6)
MCH: 30.8 pg (ref 26.0–34.0)
MCHC: 32.8 g/dL (ref 32.0–36.0)
MCV: 93.7 fL (ref 80.0–100.0)
Monocytes Absolute: 0.6 10*3/uL (ref 0.2–0.9)
Monocytes Relative: 13 %
NEUTROS PCT: 63 %
Neutro Abs: 2.7 10*3/uL (ref 1.4–6.5)
PLATELETS: 223 10*3/uL (ref 150–440)
RBC: 3.75 MIL/uL — AB (ref 3.80–5.20)
RDW: 18.3 % — ABNORMAL HIGH (ref 11.5–14.5)
WBC: 4.3 10*3/uL (ref 3.6–11.0)

## 2015-09-22 LAB — COMPREHENSIVE METABOLIC PANEL
ALBUMIN: 2.7 g/dL — AB (ref 3.5–5.0)
ALK PHOS: 77 U/L (ref 38–126)
ALT: 21 U/L (ref 14–54)
AST: 38 U/L (ref 15–41)
Anion gap: 6 (ref 5–15)
BUN: 17 mg/dL (ref 6–20)
CHLORIDE: 101 mmol/L (ref 101–111)
CO2: 29 mmol/L (ref 22–32)
CREATININE: 0.6 mg/dL (ref 0.44–1.00)
Calcium: 9.2 mg/dL (ref 8.9–10.3)
GFR calc non Af Amer: >60 mL/min (ref >60)
GLUCOSE: 124 mg/dL — AB (ref 65–99)
Potassium: 4.1 mmol/L (ref 3.5–5.1)
SODIUM: 136 mmol/L (ref 135–145)
Total Bilirubin: 0.1 mg/dL — ABNORMAL LOW (ref 0.3–1.2)
Total Protein: 5.6 g/dL — ABNORMAL LOW (ref 6.5–8.1)

## 2015-09-22 LAB — TSH: TSH: 0.049 u[IU]/mL — AB (ref 0.350–4.500)

## 2015-09-22 LAB — T4, FREE: Free T4: 1.73 ng/dL — ABNORMAL HIGH (ref 0.61–1.12)

## 2015-09-23 LAB — VITAMIN B12: Vitamin B-12: 94 pg/mL — ABNORMAL LOW (ref 180–914)

## 2015-09-24 LAB — VITAMIN D 25 HYDROXY (VIT D DEFICIENCY, FRACTURES): Vit D, 25-Hydroxy: 32 ng/mL (ref 30.0–100.0)

## 2015-09-29 DIAGNOSIS — R5383 Other fatigue: Secondary | ICD-10-CM | POA: Diagnosis not present

## 2015-09-29 LAB — COMPREHENSIVE METABOLIC PANEL
ALBUMIN: 3.2 g/dL — AB (ref 3.5–5.0)
ALT: 15 U/L (ref 14–54)
ANION GAP: 8 (ref 5–15)
AST: 18 U/L (ref 15–41)
Alkaline Phosphatase: 107 U/L (ref 38–126)
BILIRUBIN TOTAL: 0.7 mg/dL (ref 0.3–1.2)
BUN: 18 mg/dL (ref 6–20)
CO2: 26 mmol/L (ref 22–32)
Calcium: 9.2 mg/dL (ref 8.9–10.3)
Chloride: 103 mmol/L (ref 101–111)
Creatinine, Ser: 0.8 mg/dL (ref 0.44–1.00)
GLUCOSE: 130 mg/dL — AB (ref 65–99)
POTASSIUM: 4.2 mmol/L (ref 3.5–5.1)
Sodium: 137 mmol/L (ref 135–145)
TOTAL PROTEIN: 6.5 g/dL (ref 6.5–8.1)

## 2015-09-29 LAB — CBC WITH DIFFERENTIAL/PLATELET
Basophils Absolute: 0.1 10*3/uL (ref 0–0.1)
Basophils Relative: 1 %
EOS PCT: 1 %
Eosinophils Absolute: 0.1 10*3/uL (ref 0–0.7)
HCT: 37.4 % (ref 35.0–47.0)
Hemoglobin: 12.2 g/dL (ref 12.0–16.0)
LYMPHS ABS: 1 10*3/uL (ref 1.0–3.6)
LYMPHS PCT: 10 %
MCH: 30.8 pg (ref 26.0–34.0)
MCHC: 32.8 g/dL (ref 32.0–36.0)
MCV: 93.9 fL (ref 80.0–100.0)
MONO ABS: 0.8 10*3/uL (ref 0.2–0.9)
Monocytes Relative: 8 %
Neutro Abs: 8.2 10*3/uL — ABNORMAL HIGH (ref 1.4–6.5)
Neutrophils Relative %: 80 %
PLATELETS: 216 10*3/uL (ref 150–440)
RBC: 3.98 MIL/uL (ref 3.80–5.20)
RDW: 17.7 % — AB (ref 11.5–14.5)
WBC: 10.1 10*3/uL (ref 3.6–11.0)

## 2015-09-29 LAB — URINALYSIS COMPLETE WITH MICROSCOPIC (ARMC ONLY)
Bilirubin Urine: NEGATIVE
GLUCOSE, UA: NEGATIVE mg/dL
Hgb urine dipstick: NEGATIVE
NITRITE: NEGATIVE
Protein, ur: >500 mg/dL — AB
SPECIFIC GRAVITY, URINE: 1.016 (ref 1.005–1.030)
WBC, UA: NONE SEEN WBC/hpf (ref 0–5)
pH: 9 — ABNORMAL HIGH (ref 5.0–8.0)

## 2015-09-29 LAB — PREALBUMIN: PREALBUMIN: 15.1 mg/dL — AB (ref 18–38)

## 2015-09-29 LAB — AMMONIA: AMMONIA: 25 umol/L (ref 9–35)

## 2015-10-03 LAB — URINE CULTURE: Culture: >=100000

## 2016-03-20 ENCOUNTER — Encounter: Payer: Self-pay | Admitting: Physician Assistant

## 2016-03-20 ENCOUNTER — Emergency Department
Admission: EM | Admit: 2016-03-20 | Discharge: 2016-03-20 | Disposition: A | Discharge status: Home / Self Care | Payer: Medicare Other | Attending: Emergency Medicine | Admitting: Emergency Medicine

## 2016-03-20 ENCOUNTER — Emergency Department: Payer: Medicare Other

## 2016-03-20 DIAGNOSIS — N189 Chronic kidney disease, unspecified: Secondary | ICD-10-CM | POA: Diagnosis not present

## 2016-03-20 DIAGNOSIS — W19XXXA Unspecified fall, initial encounter: Secondary | ICD-10-CM

## 2016-03-20 DIAGNOSIS — E039 Hypothyroidism, unspecified: Secondary | ICD-10-CM | POA: Insufficient documentation

## 2016-03-20 DIAGNOSIS — M25551 Pain in right hip: Secondary | ICD-10-CM | POA: Diagnosis not present

## 2016-03-20 DIAGNOSIS — W109XXA Fall (on) (from) unspecified stairs and steps, initial encounter: Secondary | ICD-10-CM | POA: Insufficient documentation

## 2016-03-20 MED ORDER — TRAMADOL HCL 50 MG PO TABS
50.0000 mg | ORAL_TABLET | Freq: Once | ORAL | Status: AC
  Administered 2016-03-20: 50 mg via ORAL
  Filled 2016-03-20: qty 1

## 2016-03-20 MED ORDER — ACETAMINOPHEN-CODEINE #3 300-30 MG PO TABS: 1.0000 | ORAL_TABLET | Freq: Once | ORAL | Status: DC

## 2016-03-20 MED ORDER — TRAMADOL HCL 50 MG PO TABS: 50.0000 mg | ORAL_TABLET | Freq: Two times a day (BID) | ORAL | 0 refills | Status: DC

## 2016-03-20 NOTE — ED Notes (Signed)
Discharge instructions reviewed with patient. Questions fielded by this RN. Patient verbalizes understanding of instructions. Patient discharged home in stable condition per Bacon PA. No acute distress noted at time of discharge.   

## 2016-03-20 NOTE — Discharge Instructions (Signed)
Take the pain medicine as needed. Follow-up with your provider as needed.  °

## 2016-03-20 NOTE — ED Notes (Signed)
Sister is talking to [place] PA. About concerns at this time.

## 2016-03-20 NOTE — ED Triage Notes (Addendum)
Pt to triage via w/c with no distress noted; c/o right hip pain; st last Friday fell while going up steps; st pain increases with weight bearing; pt st her nurse from [facility] had come by to check on her and suggested she come in to get it checked; that nurse and sister apparently voiced concerns over patient safety to first nurse; patient A&Ox3, talkative, answers questions appropriately and denies any safety concerns or thoughts of depression

## 2016-03-20 NOTE — ED Provider Notes (Signed)
[facility] Emergency Department Provider Note ____________________________________________  Time seen: 2309  I have reviewed the triage vital signs and the nursing notes.  HISTORY  Chief Complaint  Hip Pain  HPI Marie Mullen is a 75 y.o. female patient with a history of dementia, who presents to the ED accompanied by her younger sister and a care nurse for evaluation of continued right hip pain following a near fall. The patient describeswalking up the steps of [place] [employer] [place] on Friday afternoon, when she slipped, nearly falling as she traversed the last 2 steps. She describes catching the rail to break her fall, but admits she may have still hit her right hip on the steps. She denies any head injury, loss of consciousness, scrapes, or abrasions. She has since that time had some ongoing right hip pain. She denies any distal paresthesias, foot drop, or leg length discrepancy. She does have a history of some bilateral leg weakness which is chronic in nature. She reports the pain about a 5/10 during the interview.  Past Medical History:  Diagnosis Date  . Cardiac arrest (HCC) 1990   . Chronic kidney disease   . Complication of anesthesia    slow to wake up   . Eczema   . Headache   . Hypothyroidism   . Lower extremity edema   . Pneumonia    hx of   . Weakness of both legs    knees  . WPW (Wolff-Parkinson-White syndrome)     Patient Active Problem List   Diagnosis Date Noted  . Encephalopathy 09/15/2015  . Urinary tract infection 09/15/2015  . CKD (chronic kidney disease) stage 2, GFR 60-89 ml/min 07/08/2015  . Hypothyroidism 07/08/2015  . Bladder mass 07/08/2015  . Dementia with behavioral disturbance     Past Surgical History:  Procedure Laterality Date  . ABDOMINAL HYSTERECTOMY    . BREAST ENHANCEMENT SURGERY Bilateral   . CATARACT EXTRACTION Bilateral   . CYSTOSCOPY W/ RETROGRADES Bilateral 12/28/2014   Procedure: CYSTOSCOPY  WITH RETROGRADE PYELOGRAM;  Surgeon: Vanna Scotland, MD;  Location: [facility] ORS;  Service: Urology;  Laterality: Bilateral;  . heart mapping     . TRANSURETHRAL RESECTION OF BLADDER TUMOR N/A 12/28/2014   Procedure: TRANSURETHRAL RESECTION OF BLADDER TUMOR (TURBT);  Surgeon: Vanna Scotland, MD;  Location: [facility] ORS;  Service: Urology;  Laterality: N/A;  . wpw N/A     Current Outpatient Rx  . Order #: [id number] Class: No Print  . Order #: [id number] Class: Historical Med  . Order #: [id number] Class: Historical Med  . Order #: [id number] Class: Print   Allergies Review of patient's allergies indicates no known allergies.  Family History  Problem Relation Age of Onset  . Diabetes Neg Hx    Social History Social History  Substance Use Topics  . Smoking status: Never Smoker  . Smokeless tobacco: Never Used  . Alcohol use No   Review of Systems  Constitutional: Negative for fever. Cardiovascular: Negative for chest pain. Respiratory: Negative for shortness of breath. Genitourinary: Negative for dysuria. Musculoskeletal: Negative for back pain. Reports right buttock/hip pain as above Skin: Negative for rash. Neurological: Negative for headaches, focal weakness or numbness. ____________________________________________  PHYSICAL EXAM:  VITAL SIGNS: ED Triage Vitals [03/20/16 2214]  Enc Vitals Group     BP 132/64     Pulse Rate 75     Resp 20     Temp 97.9 F (36.6 C)     Temp Source Oral     SpO2  99 %     Weight 175 lb (79.4 kg)     Height 5\' 5"  (1.651 m)     Head Circumference      Peak Flow      Pain Score 4     Pain Loc      Pain Edu?      Excl. in GC?    Constitutional: Alert and oriented. Well appearing and in no distress. Head: Normocephalic and atraumatic. Cardiovascular: Normal rate, regular rhythm.  Respiratory: Normal respiratory effort. No wheezes/rales/rhonchi. Gastrointestinal: Soft and nontender. No distention. Musculoskeletal: Normal spinal alignment without  midline tenderness, spasm, deformity, step-off. Patient with mild tenderness to palpation over the right gluteal region. She is able to demonstrate normal hip flexion and extension range on exam. She's got normal hip internal and external rotation noted. Negative seated straight leg raise. Nontender with normal range of motion in all extremities.  Neurologic:  Normal gait without ataxia. Normal speech and language. No gross focal neurologic deficits are appreciated. Skin:  Skin is warm, dry and intact. No rash noted. Psychiatric: Mood and affect are normal. Patient exhibits appropriate insight and judgment. ____________________________________________   RADIOLOGY  IMPRESSION: Negative. ____________________________________________  PROCEDURES  Ultram 50 mg PO ____________________________________________  INITIAL IMPRESSION / ASSESSMENT AND PLAN / ED COURSE  Patient with a mechanical fall 3 days prior resulted in pain to the right hip. She presents today with continued right hip pain, with a reassuring exam and negative plain film x-rays. Patient is discharged with a prescription for #10 Ultram dose as needed for pain. She may also dose over-the-counter Tylenol or Motrin as needed for pain relief. She should follow up with primary care provider for ongoing symptom management.  Clinical Course   ____________________________________________  FINAL CLINICAL IMPRESSION(S) / ED DIAGNOSES  Final diagnoses:  Right hip pain  Fall, initial encounter     Lissa Hoard, PA-C 03/20/16 2344    Sharyn Creamer, MD 03/21/16 [phone]

## 2016-03-20 NOTE — ED Notes (Signed)
Pt present to ED with c/o right hip pain. Pt  states last Friday fell while going up steps statets she caught the arm rail. Pt states pain increases with weight bearing and has weaknes to both legs even before the fall. Pt states a staff member from [facility] had come by to check on her and suggested she come in to get it checked.  That staff member step out into hall to voiced concerns over patient safety, home environment, threats of hurting self wanting to die, and wanting to have the sister talk to PA away from the patient. Patient is  A&Ox3, talkative, answers questions but appropriately but noted signs of dementia. Pt denies SI/HI.

## 2016-03-20 NOTE — ED Notes (Signed)
MD at bedside. 

## 2016-03-29 ENCOUNTER — Emergency Department
Admission: EM | Admit: 2016-03-29 | Discharge: 2016-03-29 | Disposition: A | Discharge status: Home / Self Care | Payer: Medicare Other | Attending: Emergency Medicine | Admitting: Emergency Medicine

## 2016-03-29 ENCOUNTER — Encounter: Payer: Self-pay | Admitting: Emergency Medicine

## 2016-03-29 DIAGNOSIS — N182 Chronic kidney disease, stage 2 (mild): Secondary | ICD-10-CM | POA: Insufficient documentation

## 2016-03-29 DIAGNOSIS — E039 Hypothyroidism, unspecified: Secondary | ICD-10-CM | POA: Insufficient documentation

## 2016-03-29 DIAGNOSIS — R21 Rash and other nonspecific skin eruption: Secondary | ICD-10-CM | POA: Diagnosis present

## 2016-03-29 DIAGNOSIS — L0100 Impetigo, unspecified: Secondary | ICD-10-CM | POA: Insufficient documentation

## 2016-03-29 DIAGNOSIS — L259 Unspecified contact dermatitis, unspecified cause: Secondary | ICD-10-CM | POA: Insufficient documentation

## 2016-03-29 MED ORDER — FAMOTIDINE 20 MG PO TABS: 20.0000 mg | ORAL_TABLET | Freq: Every day | ORAL | 0 refills | Status: DC

## 2016-03-29 MED ORDER — CEPHALEXIN 500 MG PO CAPS
500.0000 mg | ORAL_CAPSULE | Freq: Three times a day (TID) | ORAL | 0 refills | Status: DC
Start: 2016-03-29 — End: 2016-07-27

## 2016-03-29 MED ORDER — FAMOTIDINE 20 MG PO TABS
40.0000 mg | ORAL_TABLET | Freq: Once | ORAL | Status: AC
  Administered 2016-03-29: 40 mg via ORAL
  Filled 2016-03-29: qty 2

## 2016-03-29 MED ORDER — DEXAMETHASONE SODIUM PHOSPHATE 10 MG/ML IJ SOLN
10.0000 mg | Freq: Once | INTRAMUSCULAR | Status: AC
  Administered 2016-03-29: 10 mg via INTRAMUSCULAR
  Filled 2016-03-29: qty 1

## 2016-03-29 NOTE — ED Provider Notes (Signed)
[facility] Emergency Department Provider Note  ____________________________________________  Time seen: Approximately 5:03 PM  I have reviewed the triage vital signs and the nursing notes.   HISTORY  Chief Complaint Rash    HPI NAVAEH KEHRES is a 75 y.o. female , NAD, presents to the emergency department with 1 week history of rash and swelling to the right forearm. States she noted a flat rash began on her arm approximately one week ago. Has spread throughout her right forearm which is cause some swelling. Notes that the area is significantly itchy and she has been scratching causing open wounds to the skin. Has noted some oozing and weeping at some sites. Denies any fevers, chills, body aches, fatigue, chest pain, shortness breath, abdominal pain, nausea, vomiting. Has been taking an over-the-counter allergy relief tablet which has not been helping with the itching. Denies any known exposures to environmental allergens. Does note that she recently changed detergent to a free and clear pod and is wondering if this could be the cause. Denies rash to any other part of her body. Has not had any difficulty swallowing or breathing. Has not had any swelling about the face/lip/tongue/throat.   Past Medical History:  Diagnosis Date  . Cardiac arrest (HCC) 1990   . Chronic kidney disease   . Complication of anesthesia    slow to wake up   . Eczema   . Headache   . Hypothyroidism   . Lower extremity edema   . Pneumonia    hx of   . Weakness of both legs    knees  . WPW (Wolff-Parkinson-White syndrome)     Patient Active Problem List   Diagnosis Date Noted  . Encephalopathy 09/15/2015  . Urinary tract infection 09/15/2015  . CKD (chronic kidney disease) stage 2, GFR 60-89 ml/min 07/08/2015  . Hypothyroidism 07/08/2015  . Bladder mass 07/08/2015  . Dementia with behavioral disturbance     Past Surgical History:  Procedure Laterality Date  . ABDOMINAL  HYSTERECTOMY    . BREAST ENHANCEMENT SURGERY Bilateral   . CATARACT EXTRACTION Bilateral   . CYSTOSCOPY W/ RETROGRADES Bilateral 12/28/2014   Procedure: CYSTOSCOPY WITH RETROGRADE PYELOGRAM;  Surgeon: Vanna Scotland, MD;  Location: [facility] ORS;  Service: Urology;  Laterality: Bilateral;  . heart mapping     . TRANSURETHRAL RESECTION OF BLADDER TUMOR N/A 12/28/2014   Procedure: TRANSURETHRAL RESECTION OF BLADDER TUMOR (TURBT);  Surgeon: Vanna Scotland, MD;  Location: [facility] ORS;  Service: Urology;  Laterality: N/A;  . wpw N/A     Prior to Admission medications   Medication Sig Start Date End Date Taking? Authorizing Provider  cefdinir (OMNICEF) 300 MG capsule Take 1 capsule (300 mg total) by mouth 2 (two) times daily. 09/19/15   Houston Siren, MD  cephALEXin (KEFLEX) 500 MG capsule Take 1 capsule (500 mg total) by mouth 3 (three) times daily. 03/29/16   Jami L Hagler, PA-C  famotidine (PEPCID) 20 MG tablet Take 1 tablet (20 mg total) by mouth daily. 03/29/16 04/05/16  Jami L Hagler, PA-C  levothyroxine (SYNTHROID, LEVOTHROID) 150 MCG tablet Take 150 mcg by mouth daily before breakfast.    Historical Provider, MD  potassium chloride (K-DUR) 10 MEQ tablet Take 10 mEq by mouth daily.    Historical Provider, MD  traMADol (ULTRAM) 50 MG tablet Take 1 tablet (50 mg total) by mouth 2 (two) times daily. 03/20/16   Jenise V Bacon Menshew, PA-C    Allergies Review of patient's allergies indicates no  known allergies.  Family History  Problem Relation Age of Onset  . Diabetes Neg Hx     Social History Social History  Substance Use Topics  . Smoking status: Never Smoker  . Smokeless tobacco: Never Used  . Alcohol use No     Review of Systems  Constitutional: No fever/chills ENT:  No swelling about lip/tongue/throat. No difficulty swallowing. Cardiovascular: No chest pain. Respiratory:No shortness of breath. No wheezing.  Gastrointestinal: No abdominal pain.  No nausea, vomiting.   Musculoskeletal:  Negative for back pain.  Skin: Positive for rash. Neurological: Negative for headaches, focal weakness or numbness. 10-point ROS otherwise negative.  ____________________________________________   PHYSICAL EXAM:  VITAL SIGNS: ED Triage Vitals  Enc Vitals Group     BP 03/29/16 1656 126/68     Pulse Rate 03/29/16 1656 82     Resp 03/29/16 1656 18     Temp 03/29/16 1656 98.2 F (36.8 C)     Temp Source 03/29/16 1656 Oral     SpO2 03/29/16 1656 96 %     Weight 03/29/16 1646 164 lb (74.4 kg)     Height 03/29/16 1646 5\' 4"  (1.626 m)     Head Circumference --      Peak Flow --      Pain Score 03/29/16 1648 6     Pain Loc --      Pain Edu? --      Excl. in GC? --      Constitutional: Alert and oriented. Well appearing and in no acute distress. Eyes: Conjunctivae are normal ithout icterus or injection Head: Atraumatic. Neck: Supple with full range of motion Hematological/Lymphatic/Immunilogical: No cervical lymphadenopathy. Cardiovascular:  Good peripheral circulation with 2+ pulses noted in the right upper extremity. Respiratory: Normal respiratory effort without tachypnea or retractions.  Musculoskeletal: Full range of motion of the right upper extremity without difficulty or pain. No joint effusions. Neurologic:  Normal speech and language. No gross focal neurologic deficits are appreciated.  Skin:  Right forearm with diffuse erythematous, elevated maculopapular rash with diffuse excoriations from the patient scratching. 4 lesions oozing yellowish fluid. No tenderness to palpation. No induration or fluctuance is noted. Skin is warm, dry and intact.  Psychiatric: Mood and affect are normal. Speech and behavior are normal. Patient exhibits appropriate insight and judgement.   ____________________________________________    LABS  None ____________________________________________  EKG  None ____________________________________________  RADIOLOGY  None ____________________________________________    PROCEDURES  Procedure(s) performed: None   Procedures   Medications  dexamethasone (DECADRON) injection 10 mg (10 mg Intramuscular Given 03/29/16 1744)  famotidine (PEPCID) tablet 40 mg (40 mg Oral Given 03/29/16 1744)     ____________________________________________   INITIAL IMPRESSION / ASSESSMENT AND PLAN / ED COURSE  Pertinent labs & imaging results that were available during my care of the patient were reviewed by me and considered in my medical decision making (see chart for details).  Clinical Course    Patient's diagnosis is consistent with Contact dermatitis and impetigo. Patient will be discharged home with prescriptions for Keflex and Pepcid to take as directed. Patient is to continue to take her allergy relief pill once daily. May apply cool compresses to the affected area to decrease itching. Highly advised the patient to do her best not to scratch the area. Patient is to follow up with her primary care provider if symptoms persist past this treatment course. Patient is given ED precautions to return to the ED for any worsening or new symptoms.  ____________________________________________  FINAL CLINICAL IMPRESSION(S) / ED DIAGNOSES  Final diagnoses:  Contact dermatitis  Impetigo      NEW MEDICATIONS STARTED DURING THIS VISIT:  New Prescriptions   CEPHALEXIN (KEFLEX) 500 MG CAPSULE    Take 1 capsule (500 mg total) by mouth 3 (three) times daily.   FAMOTIDINE (PEPCID) 20 MG TABLET    Take 1 tablet (20 mg total) by mouth daily.         Hope Pigeon, PA-C 03/29/16 1807    Jeanmarie Plant, MD 03/29/16 2215

## 2016-03-29 NOTE — ED Triage Notes (Signed)
Patient presents to the ED with rash and swelling to her right arm.  Patient states she was seen in the ED for the same recently.  Patient is alert and oriented with no obvious distress at this time.

## 2016-03-29 NOTE — ED Notes (Signed)
Pt with red rash and edema to right arm  Pt reports itching - abrasions noted to site where pt has been scratching

## 2016-03-29 NOTE — Discharge Instructions (Signed)
Please see your Primary Care Physician in 2 days if not improving.   Apply cool compress to alleviate itching.

## 2016-04-02 ENCOUNTER — Emergency Department: Payer: Medicare Other

## 2016-04-02 ENCOUNTER — Encounter: Payer: Self-pay | Admitting: Emergency Medicine

## 2016-04-02 DIAGNOSIS — L03113 Cellulitis of right upper limb: Secondary | ICD-10-CM | POA: Diagnosis not present

## 2016-04-02 DIAGNOSIS — R0602 Shortness of breath: Secondary | ICD-10-CM | POA: Diagnosis present

## 2016-04-02 DIAGNOSIS — N3001 Acute cystitis with hematuria: Secondary | ICD-10-CM | POA: Diagnosis not present

## 2016-04-02 DIAGNOSIS — E039 Hypothyroidism, unspecified: Secondary | ICD-10-CM | POA: Diagnosis not present

## 2016-04-02 DIAGNOSIS — Z79899 Other long term (current) drug therapy: Secondary | ICD-10-CM | POA: Insufficient documentation

## 2016-04-02 DIAGNOSIS — N182 Chronic kidney disease, stage 2 (mild): Secondary | ICD-10-CM | POA: Diagnosis not present

## 2016-04-02 LAB — CBC
HEMATOCRIT: 39.3 % (ref 35.0–47.0)
HEMOGLOBIN: 13 g/dL (ref 12.0–16.0)
MCH: 28.9 pg (ref 26.0–34.0)
MCHC: 33.2 g/dL (ref 32.0–36.0)
MCV: 87.2 fL (ref 80.0–100.0)
Platelets: 214 10*3/uL (ref 150–440)
RBC: 4.51 MIL/uL (ref 3.80–5.20)
RDW: 15 % — ABNORMAL HIGH (ref 11.5–14.5)
WBC: 8.9 10*3/uL (ref 3.6–11.0)

## 2016-04-02 LAB — BASIC METABOLIC PANEL
ANION GAP: 6 (ref 5–15)
BUN: 19 mg/dL (ref 6–20)
CALCIUM: 9 mg/dL (ref 8.9–10.3)
CHLORIDE: 107 mmol/L (ref 101–111)
CO2: 27 mmol/L (ref 22–32)
Creatinine, Ser: 0.9 mg/dL (ref 0.44–1.00)
GFR calc non Af Amer: >60 mL/min (ref >60)
GLUCOSE: 143 mg/dL — AB (ref 65–99)
POTASSIUM: 4.2 mmol/L (ref 3.5–5.1)
Sodium: 140 mmol/L (ref 135–145)

## 2016-04-02 LAB — TROPONIN I

## 2016-04-02 NOTE — ED Triage Notes (Signed)
Pt ambulatory to triage with steady gait with c/o shortness of breath. Pt reports ran errands tonight and states after running errands felt short of breath. Pt denies abdominal pain, chest pain, or dizziness. Pt speaking in complete sentences without shortness of breath. Pt alert and oriented x 4, no increased work in breathing, skin warm and dry.

## 2016-04-03 ENCOUNTER — Emergency Department
Admission: EM | Admit: 2016-04-03 | Discharge: 2016-04-03 | Discharge status: Against Medical Advice | Payer: Medicare Other | Attending: Emergency Medicine | Admitting: Emergency Medicine

## 2016-04-03 DIAGNOSIS — L03113 Cellulitis of right upper limb: Secondary | ICD-10-CM

## 2016-04-03 DIAGNOSIS — N3001 Acute cystitis with hematuria: Secondary | ICD-10-CM

## 2016-04-03 DIAGNOSIS — L309 Dermatitis, unspecified: Secondary | ICD-10-CM

## 2016-04-03 DIAGNOSIS — R002 Palpitations: Secondary | ICD-10-CM

## 2016-04-03 LAB — TROPONIN I

## 2016-04-03 LAB — MAGNESIUM: Magnesium: 2.3 mg/dL (ref 1.7–2.4)

## 2016-04-03 LAB — URINALYSIS COMPLETE WITH MICROSCOPIC (ARMC ONLY)
BILIRUBIN URINE: NEGATIVE
Glucose, UA: NEGATIVE mg/dL
KETONES UR: NEGATIVE mg/dL
Nitrite: NEGATIVE
PROTEIN: 100 mg/dL — AB
Specific Gravity, Urine: 1.01 (ref 1.005–1.030)
pH: 8 (ref 5.0–8.0)

## 2016-04-03 LAB — TSH: TSH: 21.233 u[IU]/mL — AB (ref 0.350–4.500)

## 2016-04-03 MED ORDER — CLINDAMYCIN HCL 150 MG PO CAPS
300.0000 mg | ORAL_CAPSULE | Freq: Once | ORAL | Status: AC
  Administered 2016-04-03: 300 mg via ORAL
  Filled 2016-04-03: qty 2

## 2016-04-03 MED ORDER — NITROFURANTOIN MONOHYD MACRO 100 MG PO CAPS: 100.0000 mg | ORAL_CAPSULE | Freq: Two times a day (BID) | ORAL | 0 refills | Status: AC

## 2016-04-03 MED ORDER — CLINDAMYCIN HCL 300 MG PO CAPS: 300.0000 mg | ORAL_CAPSULE | Freq: Three times a day (TID) | ORAL | 0 refills | Status: AC

## 2016-04-03 MED ORDER — DEXTROSE 5 % IV SOLN
1.0000 g | Freq: Once | INTRAVENOUS | Status: AC
  Administered 2016-04-03: 1 g via INTRAVENOUS
  Filled 2016-04-03: qty 10

## 2016-04-03 NOTE — ED Notes (Signed)
Pt is confused and resistant to instructions and education. Pt drove self to the ED last night. Pt repeats herself constantly and had vague complaints of shortness of breath which she has not exhibited during her visit. Pt is somewhat unsteady on feet and has foul odor to urine. Pt refuses to call anyone to give her a ride home and lives alone. Pt states she left her garage door open and the lights on at her home. Pt is poorly redirected and was unable to say where she lived when initially asked.

## 2016-04-03 NOTE — ED Notes (Signed)
Pt refused admission and further treatment; sent home AMA in blue scrubs as her clothing was wet

## 2016-04-03 NOTE — ED Provider Notes (Signed)
[facility] Emergency Department Provider Note   ____________________________________________   First MD Initiated Contact with Patient 04/03/16 0230     (approximate)  I have reviewed the triage vital signs and the nursing notes.   HISTORY  Chief Complaint Shortness of Breath    HPI Marie Mullen is a 75 y.o. female comes into the hospital today with some palpitations and a rash. The patient reports that she has a history of cardiac arrest has had multiple heart surgeries and has WPW. She reports that she becomes nervous whenever her heart is not beating right. She reports that she had been out running errands and she noticed that her heart wasn't beating right. It was of regular but not fast. She called the police and they checked it out and felt that everything was okay but asked the patient if she wanted to come here by EMS or if she wanted to drive. The patient decided to drive here to be seen. She reports that the symptoms are gone now. She did not have any chest pain or shortness of breath with this. The patient also reports that she has an itchy rash up her arms. She has some ulcerations on her right upper extremity with some redness as well as the rash to her left upper extremity. The patient reports that she was seen here for this rash and was given medication but she thinks that she's completed everything. The patient is unsure what going on so wanted to come in and get checked out again.   Past Medical History:  Diagnosis Date  . Cardiac arrest (HCC) 1990   . Chronic kidney disease   . Complication of anesthesia    slow to wake up   . Eczema   . Headache   . Hypothyroidism   . Lower extremity edema   . Pneumonia    hx of   . Weakness of both legs    knees  . WPW (Wolff-Parkinson-White syndrome)     Patient Active Problem List   Diagnosis Date Noted  . Encephalopathy 09/15/2015  . Urinary tract infection 09/15/2015  . CKD (chronic  kidney disease) stage 2, GFR 60-89 ml/min 07/08/2015  . Hypothyroidism 07/08/2015  . Bladder mass 07/08/2015  . Dementia with behavioral disturbance     Past Surgical History:  Procedure Laterality Date  . ABDOMINAL HYSTERECTOMY    . BREAST ENHANCEMENT SURGERY Bilateral   . CATARACT EXTRACTION Bilateral   . CYSTOSCOPY W/ RETROGRADES Bilateral 12/28/2014   Procedure: CYSTOSCOPY WITH RETROGRADE PYELOGRAM;  Surgeon: Vanna ScotlandAshley Brandon, MD;  Location: [facility] ORS;  Service: Urology;  Laterality: Bilateral;  . heart mapping     . TRANSURETHRAL RESECTION OF BLADDER TUMOR N/A 12/28/2014   Procedure: TRANSURETHRAL RESECTION OF BLADDER TUMOR (TURBT);  Surgeon: Vanna ScotlandAshley Brandon, MD;  Location: [facility] ORS;  Service: Urology;  Laterality: N/A;  . wpw N/A     Prior to Admission medications   Medication Sig Start Date End Date Taking? Authorizing Provider  cephALEXin (KEFLEX) 500 MG capsule Take 1 capsule (500 mg total) by mouth 3 (three) times daily. 03/29/16  Yes Jami L Hagler, PA-C  famotidine (PEPCID) 20 MG tablet Take 1 tablet (20 mg total) by mouth daily. 03/29/16 04/05/16 Yes Jami L Hagler, PA-C  levothyroxine (SYNTHROID, LEVOTHROID) 150 MCG tablet Take 150 mcg by mouth daily before breakfast.   Yes Historical Provider, MD  potassium chloride (K-DUR) 10 MEQ tablet Take 10 mEq by mouth daily.   Yes Historical Provider,  MD  traMADol (ULTRAM) 50 MG tablet Take 1 tablet (50 mg total) by mouth 2 (two) times daily. 03/20/16  Yes Jenise V Bacon Menshew, PA-C  cefdinir (OMNICEF) 300 MG capsule Take 1 capsule (300 mg total) by mouth 2 (two) times daily. Patient not taking: Reported on 04/03/2016 09/19/15   Houston SirenVivek J Sainani, MD  clindamycin (CLEOCIN) 300 MG capsule Take 1 capsule (300 mg total) by mouth 3 (three) times daily. 04/03/16 04/13/16  Rebecka ApleyAllison P Webster, MD  nitrofurantoin, macrocrystal-monohydrate, (MACROBID) 100 MG capsule Take 1 capsule (100 mg total) by mouth 2 (two) times daily. 04/03/16 04/10/16  Rebecka ApleyAllison P  Webster, MD    Allergies Review of patient's allergies indicates no known allergies.  Family History  Problem Relation Age of Onset  . Diabetes Neg Hx     Social History Social History  Substance Use Topics  . Smoking status: Never Smoker  . Smokeless tobacco: Never Used  . Alcohol use No    Review of Systems Constitutional: No fever/chills Eyes: No visual changes. ENT: No sore throat. Cardiovascular: Palpitations Respiratory: Denies shortness of breath. Gastrointestinal: No abdominal pain.  No nausea, no vomiting.  No diarrhea.  No constipation. Genitourinary: Negative for dysuria. Musculoskeletal: Negative for back pain. Skin: rash. Neurological: Negative for headaches, focal weakness or numbness.  10-point ROS otherwise negative.  ____________________________________________   PHYSICAL EXAM:  VITAL SIGNS: ED Triage Vitals  Enc Vitals Group     BP 04/02/16 2240 (!) 156/75     Pulse Rate 04/02/16 2240 85     Resp 04/02/16 2240 18     Temp 04/02/16 2240 98.4 F (36.9 C)     Temp Source 04/02/16 2240 Oral     SpO2 04/02/16 2240 98 %     Weight 04/02/16 2245 164 lb (74.4 kg)     Height 04/02/16 2245 5\' 4"  (1.626 m)     Head Circumference --      Peak Flow --      Pain Score --      Pain Loc --      Pain Edu? --      Excl. in GC? --     Constitutional: Alert and oriented. Well appearing and in no acute distress. Eyes: Conjunctivae are normal. PERRL. EOMI. Head: Atraumatic. Nose: No congestion/rhinnorhea. Mouth/Throat: Mucous membranes are moist.  Oropharynx non-erythematous. Cardiovascular: Normal rate, regular rhythm. Grossly normal heart sounds.  Good peripheral circulation. Respiratory: Normal respiratory effort.  No retractions. Lungs CTAB. Gastrointestinal: Soft and nontender. No distention. Positive bowel sounds Musculoskeletal: No lower extremity tenderness nor edema.   Neurologic:  Normal speech and language. Skin:  Skin is warm, dry,  erythematous maculopapular rash to bilateral arms as well as chest and back. Many ulcerations on the right forearm with some warmth no significant tenderness to palpation.  Psychiatric: Mood and affect are normal.   ____________________________________________   LABS (all labs ordered are listed, but only abnormal results are displayed)  Labs Reviewed  BASIC METABOLIC PANEL - Abnormal; Notable for the following:       Result Value   Glucose, Bld 143 (*)    All other components within normal limits  CBC - Abnormal; Notable for the following:    RDW 15.0 (*)    All other components within normal limits  TSH - Abnormal; Notable for the following:    TSH 21.233 (*)    All other components within normal limits  URINALYSIS COMPLETEWITH MICROSCOPIC ([facility] ONLY) - Abnormal; Notable for the following:  Color, Urine YELLOW (*)    APPearance CLOUDY (*)    Hgb urine dipstick 2+ (*)    Protein, ur 100 (*)    Leukocytes, UA 1+ (*)    Bacteria, UA MANY (*)    Squamous Epithelial / LPF TOO NUMEROUS TO COUNT (*)    All other components within normal limits  URINE CULTURE  TROPONIN I  MAGNESIUM  TROPONIN I   ____________________________________________  EKG  ED ECG REPORT I, Rebecka Apley, the attending physician, personally viewed and interpreted this ECG.   Date: 04/02/2016  EKG Time: 2255  Rate: 83  Rhythm: normal sinus rhythm  Axis: left axis deviation  Intervals:none  ST&T Change: none  ____________________________________________  RADIOLOGY  CXR ____________________________________________   PROCEDURES  Procedure(s) performed: None  Procedures  Critical Care performed: No  ____________________________________________   INITIAL IMPRESSION / ASSESSMENT AND PLAN / ED COURSE  Pertinent labs & imaging results that were available during my care of the patient were reviewed by me and considered in my medical decision making (see chart for details).  This  is a 75 year old female who comes into the hospital today with some palpitations and a rash to her arms. The patient's EKG is unremarkable and her palpitation symptoms are resolved. She does have a significant rash that looks like she may be developing a mild cellulitis. I will give the patient dose of clindamycin although she had been on Keflex for impetigo. I will check a magnesium and repeat the patient's troponin after 3 hours.  Clinical Course  Value Comment By Time  DG Chest 2 View 1. No acute cardiopulmonary disease or significant interval change. 2. Emphysema. 3. Atherosclerosis of the thoracic aorta. 4. Minimal bibasilar atelectasis. 5. Mild cardiomegaly without evidence for failure. Rebecka Apley, MD 08/15 [phone]   The patient did receive a dose of ceftriaxone for her UTI. When talking to the patient more she seems very confused and we are concerned she may need to be admitted for her confusion as well as the UTI and cellulitis. I will discuss the patient with the hospitalist in preparation for admission. Rebecka Apley, MD 08/15 [phone]    The patient does not want to be admitted for her UTI. She reports that she lives right down the street and she came in more so for the palpitations. I feel that the patient should stay and that she may get worse given this UTI and her previous antibiotic use. I will have the patient sign out AGAINST MEDICAL ADVICE understanding the risks that she takes going home that she may become more ill and have worsened symptoms. The patient understands this risk and so wants to go home. She will be discharged home but encouraged to follow up with her primary care physician. The patient will be discharged AGAINST MEDICAL ADVICE. ____________________________________________   FINAL CLINICAL IMPRESSION(S) / ED DIAGNOSES  Final diagnoses:  Palpitations  Acute cystitis with hematuria  Dermatitis  Cellulitis of right upper extremity      NEW MEDICATIONS  STARTED DURING THIS VISIT:  New Prescriptions   CLINDAMYCIN (CLEOCIN) 300 MG CAPSULE    Take 1 capsule (300 mg total) by mouth 3 (three) times daily.   NITROFURANTOIN, MACROCRYSTAL-MONOHYDRATE, (MACROBID) 100 MG CAPSULE    Take 1 capsule (100 mg total) by mouth 2 (two) times daily.     Note:  This document was prepared using Dragon voice recognition software and may include unintentional dictation errors.    Rebecka Apley,  MD 04/03/16 [phone]

## 2016-04-03 NOTE — Discharge Instructions (Signed)
At this time your deciding to sign out AGAINST MEDICAL ADVICE. Given the UTI I feel that he should stay in the hospital but he reports that he did not want to stay here. I have given me some prescriptions to help with her urinary tract infection as well as her skin infection. Please follow-up with your doctor within 24 hours for reassessment and further evaluation. If any symptoms return for worse please return to the emergency department immediately. Please take all of her medications as prescribed.

## 2016-04-03 NOTE — ED Notes (Signed)
Pt states she is ready to go. Pt states that she is fine and she will not be staying in the hospital. Pt threatening to call her attorney. Discussed with Dr. Zenda AlpersWebster, who agrees that pt should stay but that she appears competent to make decisions for her care. Pt will be given prescriptions but will be signed out AMA. Pt adamant that she is not staying. Pt has been treated for her UTI and cellulitis during this visit. Pt encouraged that she needs to be working on either working things out with PCP or finding a new one, as her medical needs are best served by close follow up with PCP.

## 2016-04-03 NOTE — ED Notes (Signed)
Pt assisted to toilet 

## 2016-04-06 LAB — URINE CULTURE: Culture: >=100000 — AB

## 2016-04-26 DIAGNOSIS — Z5321 Procedure and treatment not carried out due to patient leaving prior to being seen by health care provider: Secondary | ICD-10-CM | POA: Diagnosis not present

## 2016-04-26 DIAGNOSIS — N189 Chronic kidney disease, unspecified: Secondary | ICD-10-CM | POA: Diagnosis not present

## 2016-04-26 DIAGNOSIS — E039 Hypothyroidism, unspecified: Secondary | ICD-10-CM | POA: Diagnosis not present

## 2016-04-26 DIAGNOSIS — R21 Rash and other nonspecific skin eruption: Secondary | ICD-10-CM | POA: Insufficient documentation

## 2016-04-26 NOTE — ED Triage Notes (Addendum)
Patient ambulatory to triage with steady gait, without difficulty or distress noted; pt reports right arm burning and itching since 8/1 and has been seen for same with unknown diagnosis

## 2016-04-27 ENCOUNTER — Emergency Department
Admission: EM | Admit: 2016-04-27 | Discharge: 2016-04-27 | Disposition: A | Discharge status: Home / Self Care | Payer: 59 | Attending: Emergency Medicine | Admitting: Emergency Medicine

## 2016-05-03 ENCOUNTER — Emergency Department
Admission: EM | Admit: 2016-05-03 | Discharge: 2016-05-03 | Disposition: A | Discharge status: Home / Self Care | Payer: Medicare Other | Attending: Emergency Medicine | Admitting: Emergency Medicine

## 2016-05-03 ENCOUNTER — Encounter: Payer: Self-pay | Admitting: Emergency Medicine

## 2016-05-03 DIAGNOSIS — E039 Hypothyroidism, unspecified: Secondary | ICD-10-CM | POA: Insufficient documentation

## 2016-05-03 DIAGNOSIS — F0390 Unspecified dementia without behavioral disturbance: Secondary | ICD-10-CM | POA: Diagnosis not present

## 2016-05-03 DIAGNOSIS — L2489 Irritant contact dermatitis due to other agents: Secondary | ICD-10-CM | POA: Insufficient documentation

## 2016-05-03 DIAGNOSIS — L249 Irritant contact dermatitis, unspecified cause: Secondary | ICD-10-CM

## 2016-05-03 DIAGNOSIS — R21 Rash and other nonspecific skin eruption: Secondary | ICD-10-CM | POA: Diagnosis present

## 2016-05-03 DIAGNOSIS — Z79899 Other long term (current) drug therapy: Secondary | ICD-10-CM | POA: Diagnosis not present

## 2016-05-03 DIAGNOSIS — N182 Chronic kidney disease, stage 2 (mild): Secondary | ICD-10-CM | POA: Insufficient documentation

## 2016-05-03 MED ORDER — HYDROXYZINE HCL 10 MG/5ML PO SYRP: 10.0000 mg | ORAL_SOLUTION | Freq: Three times a day (TID) | ORAL | 0 refills | Status: DC | PRN

## 2016-05-03 MED ORDER — HYDROCORTISONE VALERATE 0.2 % EX OINT: TOPICAL_OINTMENT | CUTANEOUS | 1 refills | Status: DC

## 2016-05-03 NOTE — ED Notes (Addendum)
Patient insists she has not been exposed to poison ivy/oak but appears to have comparable rash.  She denies being in contact with anyone that has been exposed to poison ivy/oak.  Pt has been scratching her arms and has self tx with calamine lotion on both arms and left leg.  Pt has notable rash on her back with excoriations where she has scratched the sites repeatedly.  There is no bleeding from these sites.

## 2016-05-03 NOTE — ED Triage Notes (Signed)
Pt c/o dry scaly rash on right arm.

## 2016-05-03 NOTE — ED Provider Notes (Signed)
[facility] Emergency Department Provider Note   ____________________________________________   None    (approximate)  I have reviewed the triage vital signs and the nursing notes.   HISTORY  Chief Complaint Abrasion    HPI Marie Mullen is a 75 y.o. female patient complaining of a dry scaly rash bilateral arms approximately 3 weeks. Patient believes the rash is secondary to her water. Patient states she's replace a sink secondary to rest and she still having problems to elucidate secondary to what she believes chemicals in the water. Patient states the rash is itching and she is concerned of kidney infection from scratching . SYMPTOM/COMPLAINT LOCATION(describe anatomically) DURATION(when did it start) TIMING(onset and pattern) SEVERITY(0-10, mild/moderate/severe) QUALITY(description of symptoms) CONTEXT(recent surgery, new meds, activity, etc.) MODIFYINGFACTORS(what makes it better/worse) ASSOCIATEDSYMPTOMS(pertinent positives and negatives)**} Past Medical History:  Diagnosis Date  . Cardiac arrest (HCC) 1990   . Chronic kidney disease   . Complication of anesthesia    slow to wake up   . Eczema   . Headache   . Hypothyroidism   . Lower extremity edema   . Pneumonia    hx of   . Weakness of both legs    knees  . WPW (Wolff-Parkinson-White syndrome)     Patient Active Problem List   Diagnosis Date Noted  . Encephalopathy 09/15/2015  . Urinary tract infection 09/15/2015  . CKD (chronic kidney disease) stage 2, GFR 60-89 ml/min 07/08/2015  . Hypothyroidism 07/08/2015  . Bladder mass 07/08/2015  . Dementia with behavioral disturbance     Past Surgical History:  Procedure Laterality Date  . ABDOMINAL HYSTERECTOMY    . BREAST ENHANCEMENT SURGERY Bilateral   . CATARACT EXTRACTION Bilateral   . CYSTOSCOPY W/ RETROGRADES Bilateral 12/28/2014   Procedure: CYSTOSCOPY WITH RETROGRADE PYELOGRAM;  Surgeon: Vanna Scotland, MD;   Location: [facility] ORS;  Service: Urology;  Laterality: Bilateral;  . heart mapping     . TRANSURETHRAL RESECTION OF BLADDER TUMOR N/A 12/28/2014   Procedure: TRANSURETHRAL RESECTION OF BLADDER TUMOR (TURBT);  Surgeon: Vanna Scotland, MD;  Location: [facility] ORS;  Service: Urology;  Laterality: N/A;  . wpw N/A     Prior to Admission medications   Medication Sig Start Date End Date Taking? Authorizing Provider  cefdinir (OMNICEF) 300 MG capsule Take 1 capsule (300 mg total) by mouth 2 (two) times daily. Patient not taking: Reported on 04/03/2016 09/19/15   Houston Siren, MD  cephALEXin (KEFLEX) 500 MG capsule Take 1 capsule (500 mg total) by mouth 3 (three) times daily. 03/29/16   Jami L Hagler, PA-C  famotidine (PEPCID) 20 MG tablet Take 1 tablet (20 mg total) by mouth daily. 03/29/16 04/05/16  Jami L Hagler, PA-C  hydrocortisone valerate ointment (WESTCORT) 0.2 % Apply to affected area daily 05/03/16 05/03/17  Joni Reining, PA-C  hydrOXYzine (ATARAX) 10 MG/5ML syrup Take 5 mLs (10 mg total) by mouth 3 (three) times daily as needed for itching. 05/03/16   Joni Reining, PA-C  levothyroxine (SYNTHROID, LEVOTHROID) 150 MCG tablet Take 150 mcg by mouth daily before breakfast.    Historical Provider, MD  potassium chloride (K-DUR) 10 MEQ tablet Take 10 mEq by mouth daily.    Historical Provider, MD  traMADol (ULTRAM) 50 MG tablet Take 1 tablet (50 mg total) by mouth 2 (two) times daily. 03/20/16   Jenise V Bacon Menshew, PA-C    Allergies Review of patient's allergies indicates no known allergies.  Family History  Problem Relation Age of  Onset  . Diabetes Neg Hx     Social History Social History  Substance Use Topics  . Smoking status: Never Smoker  . Smokeless tobacco: Never Used  . Alcohol use No    Review of Systems Constitutional: No fever/chills Eyes: No visual changes. ENT: No sore throat. Cardiovascular: Denies chest pain. Respiratory: Denies shortness of breath. Gastrointestinal: No  abdominal pain.  No nausea, no vomiting.  No diarrhea.  No constipation. Genitourinary: Negative for dysuria. Musculoskeletal: Negative for back pain. Skin: Positive for rash. Neurological: Negative for headaches, focal weakness or numbness. Psychiatric:Dementia Endocrine:Hypothyroidism  ____________________________________________   PHYSICAL EXAM:  VITAL SIGNS: ED Triage Vitals [05/03/16 1824]  Enc Vitals Group     BP (!) 146/84     Pulse Rate 76     Resp 18     Temp 98.2 F (36.8 C)     Temp Source Oral     SpO2 99 %     Weight 160 lb (72.6 kg)     Height 5\' 4"  (1.626 m)     Head Circumference      Peak Flow      Pain Score      Pain Loc      Pain Edu?      Excl. in GC?     Constitutional: Alert and oriented. Well appearing and in no acute distress. Eyes: Conjunctivae are normal. PERRL. EOMI. Head: Atraumatic. Nose: No congestion/rhinnorhea. Mouth/Throat: Mucous membranes are moist.  Oropharynx non-erythematous. Neck: No stridor.  No cervical spine tenderness to palpation. Hematological/Lymphatic/Immunilogical: No cervical lymphadenopathy. Cardiovascular: Normal rate, regular rhythm. Grossly normal heart sounds.  Good peripheral circulation. Respiratory: Normal respiratory effort.  No retractions. Lungs CTAB. Gastrointestinal: Soft and nontender. No distention. No abdominal bruits. No CVA tenderness. Musculoskeletal: No lower extremity tenderness nor edema.  No joint effusions. Neurologic:  Normal speech and language. No gross focal neurologic deficits are appreciated. No gait instability. Skin: Patient has multiple maculopapular lesions bilateral arms. There is sinus score GH but no signs of secondary infection.  Psychiatric: Mood and affect are normal. Speech and behavior are normal.  ____________________________________________   LABS (all labs ordered are listed, but only abnormal results are displayed)  Labs Reviewed - No data to  display ____________________________________________  EKG   ____________________________________________  RADIOLOGY   ____________________________________________   PROCEDURES  Procedure(s) performed: None  Procedures  Critical Care performed: No  ____________________________________________   INITIAL IMPRESSION / ASSESSMENT AND PLAN / ED COURSE  Pertinent labs & imaging results that were available during my care of the patient were reviewed by me and considered in my medical decision making (see chart for details).  Contact dermatitis. Patient given discharge instructions. Patient given a prescription for Westcort and Atarax elixir   Clinical Course     ____________________________________________   FINAL CLINICAL IMPRESSION(S) / ED DIAGNOSES  Final diagnoses:  Irritant contact dermatitis      NEW MEDICATIONS STARTED DURING THIS VISIT:  New Prescriptions   HYDROCORTISONE VALERATE OINTMENT (WESTCORT) 0.2 %    Apply to affected area daily   HYDROXYZINE (ATARAX) 10 MG/5ML SYRUP    Take 5 mLs (10 mg total) by mouth 3 (three) times daily as needed for itching.     Note:  This document was prepared using Dragon voice recognition software and may include unintentional dictation errors.    Joni ReiningRonald K Denisse Whitenack, PA-C 05/03/16 1958    Loleta Roseory Forbach, MD 05/03/16 2121

## 2016-05-03 NOTE — ED Notes (Signed)
PA @ beside

## 2016-07-25 ENCOUNTER — Encounter: Payer: Self-pay | Admitting: Emergency Medicine

## 2016-07-25 ENCOUNTER — Emergency Department
Admission: EM | Admit: 2016-07-25 | Discharge: 2016-07-25 | Disposition: A | Discharge status: Home / Self Care | Payer: Medicare Other | Attending: Emergency Medicine | Admitting: Emergency Medicine

## 2016-07-25 DIAGNOSIS — E039 Hypothyroidism, unspecified: Secondary | ICD-10-CM | POA: Insufficient documentation

## 2016-07-25 DIAGNOSIS — N182 Chronic kidney disease, stage 2 (mild): Secondary | ICD-10-CM | POA: Diagnosis not present

## 2016-07-25 DIAGNOSIS — M25571 Pain in right ankle and joints of right foot: Secondary | ICD-10-CM | POA: Diagnosis present

## 2016-07-25 DIAGNOSIS — M79671 Pain in right foot: Secondary | ICD-10-CM

## 2016-07-25 DIAGNOSIS — M79672 Pain in left foot: Secondary | ICD-10-CM

## 2016-07-25 DIAGNOSIS — M25572 Pain in left ankle and joints of left foot: Secondary | ICD-10-CM | POA: Insufficient documentation

## 2016-07-25 LAB — CBC
HCT: 41 % (ref 35.0–47.0)
Hemoglobin: 13.7 g/dL (ref 12.0–16.0)
MCH: 28.7 pg (ref 26.0–34.0)
MCHC: 33.5 g/dL (ref 32.0–36.0)
MCV: 85.8 fL (ref 80.0–100.0)
PLATELETS: 215 10*3/uL (ref 150–440)
RBC: 4.78 MIL/uL (ref 3.80–5.20)
RDW: 15.9 % — AB (ref 11.5–14.5)
WBC: 7.3 10*3/uL (ref 3.6–11.0)

## 2016-07-25 LAB — URINALYSIS, COMPLETE (UACMP) WITH MICROSCOPIC
Bilirubin Urine: NEGATIVE
GLUCOSE, UA: NEGATIVE mg/dL
KETONES UR: 5 mg/dL — AB
Nitrite: NEGATIVE
PROTEIN: 100 mg/dL — AB
Specific Gravity, Urine: 1.015 (ref 1.005–1.030)
pH: 7 (ref 5.0–8.0)

## 2016-07-25 LAB — BASIC METABOLIC PANEL
Anion gap: 8 (ref 5–15)
BUN: 19 mg/dL (ref 6–20)
CALCIUM: 9.1 mg/dL (ref 8.9–10.3)
CO2: 28 mmol/L (ref 22–32)
Chloride: 103 mmol/L (ref 101–111)
Creatinine, Ser: 1.08 mg/dL — ABNORMAL HIGH (ref 0.44–1.00)
GFR calc Af Amer: 56 mL/min — ABNORMAL LOW (ref >60)
GFR, EST NON AFRICAN AMERICAN: 48 mL/min — AB (ref >60)
GLUCOSE: 169 mg/dL — AB (ref 65–99)
Potassium: 3.4 mmol/L — ABNORMAL LOW (ref 3.5–5.1)
SODIUM: 139 mmol/L (ref 135–145)

## 2016-07-25 MED ORDER — FOSFOMYCIN TROMETHAMINE 3 G PO PACK
3.0000 g | PACK | ORAL | Status: AC
  Administered 2016-07-25: 3 g via ORAL
  Filled 2016-07-25: qty 3

## 2016-07-25 NOTE — ED Triage Notes (Signed)
PT presents with numbness in both lower legs. She states she can't feel them. Pt states she has "spots" all over her body; this nurse saw spots on back. Pt states she has seen dermatologist for it, but the dermatologist hasn't given her an answer. Caregiver states that her urine smells foul. Pt alert & oriented. NAD noted.

## 2016-07-25 NOTE — ED Notes (Signed)
Pt c/o numbness in both lower legs. Caregiver states that her urine smells foul. Pt alert & oriented. NAD noted. Urine collected and foul odor noted to urine.

## 2016-07-25 NOTE — ED Notes (Signed)
Pt in hallway bed from lobby. Pt ambulating to bathroom for urine specimen. Caregiver at bedside.

## 2016-07-25 NOTE — ED Provider Notes (Signed)
[facility] Emergency Department Provider Note   ____________________________________________   First MD Initiated Contact with Patient 07/25/16 1414     (approximate)  I have reviewed the triage vital signs and the nursing notes.   HISTORY  Chief Complaint Leg Pain  HPI Marie Mullen is a 75 y.o. female previous history of hypothyroidism, chronic kidney disease, previous reported cardiac arrest and WPW.  Patient reports to me that for the last several months she's been experiencing tingling and numbness over the toes of both legs and feet. She reports that this tingling has progressed to the point it very difficult for her to feel her toes, and has a stinging sensation. No weakness. Denies any headache or trouble speaking or weakness in the legs or arms, she is able to move her feet well reports the bottom of her feet on both sides feels very numb and tingly  Not associated with any back pain.   Past Medical History:  Diagnosis Date  . Cardiac arrest (HCC) 1990   . Chronic kidney disease   . Complication of anesthesia    slow to wake up   . Eczema   . Headache   . Hypothyroidism   . Lower extremity edema   . Pneumonia    hx of   . Weakness of both legs    knees  . WPW (Wolff-Parkinson-White syndrome)     Patient Active Problem List   Diagnosis Date Noted  . Encephalopathy 09/15/2015  . Urinary tract infection 09/15/2015  . CKD (chronic kidney disease) stage 2, GFR 60-89 ml/min 07/08/2015  . Hypothyroidism 07/08/2015  . Bladder mass 07/08/2015  . Dementia with behavioral disturbance     Past Surgical History:  Procedure Laterality Date  . ABDOMINAL HYSTERECTOMY    . BREAST ENHANCEMENT SURGERY Bilateral   . CATARACT EXTRACTION Bilateral   . CYSTOSCOPY W/ RETROGRADES Bilateral 12/28/2014   Procedure: CYSTOSCOPY WITH RETROGRADE PYELOGRAM;  Surgeon: Vanna Scotland, MD;  Location: [facility] ORS;  Service: Urology;  Laterality: Bilateral;  .  heart mapping     . TRANSURETHRAL RESECTION OF BLADDER TUMOR N/A 12/28/2014   Procedure: TRANSURETHRAL RESECTION OF BLADDER TUMOR (TURBT);  Surgeon: Vanna Scotland, MD;  Location: [facility] ORS;  Service: Urology;  Laterality: N/A;  . wpw N/A     Prior to Admission medications   Medication Sig Start Date End Date Taking? Authorizing Provider  cefdinir (OMNICEF) 300 MG capsule Take 1 capsule (300 mg total) by mouth 2 (two) times daily. Patient not taking: Reported on 04/03/2016 09/19/15   Houston Siren, MD  cephALEXin (KEFLEX) 500 MG capsule Take 1 capsule (500 mg total) by mouth 3 (three) times daily. 03/29/16   Jami L Hagler, PA-C  famotidine (PEPCID) 20 MG tablet Take 1 tablet (20 mg total) by mouth daily. 03/29/16 04/05/16  Jami L Hagler, PA-C  hydrocortisone valerate ointment (WESTCORT) 0.2 % Apply to affected area daily 05/03/16 05/03/17  Joni Reining, PA-C  hydrOXYzine (ATARAX) 10 MG/5ML syrup Take 5 mLs (10 mg total) by mouth 3 (three) times daily as needed for itching. 05/03/16   Joni Reining, PA-C  levothyroxine (SYNTHROID, LEVOTHROID) 150 MCG tablet Take 150 mcg by mouth daily before breakfast.    Historical Provider, MD  potassium chloride (K-DUR) 10 MEQ tablet Take 10 mEq by mouth daily.    Historical Provider, MD  traMADol (ULTRAM) 50 MG tablet Take 1 tablet (50 mg total) by mouth 2 (two) times daily. 03/20/16   Jenise  V Bacon Menshew, PA-C    Allergies Patient has no known allergies.  Family History  Problem Relation Age of Onset  . Diabetes Neg Hx     Social History Social History  Substance Use Topics  . Smoking status: Never Smoker  . Smokeless tobacco: Never Used  . Alcohol use No    Review of Systems Constitutional: No fever/chills Eyes: No visual changes. ENT: No sore throat. Cardiovascular: Denies chest pain. Respiratory: Denies shortness of breath. Gastrointestinal: No abdominal pain.  No nausea, no vomiting.   Genitourinary: Reports her caretaker noted her urine  seemed a little off a couple days ago, and had recommended she have this evaluated at the ER for a possible "UTI" Musculoskeletal: Negative for back pain. Skin: She is a spotty rash that she's been following with dermatology that has been unchanged for quite some time but she reports they have not yet provided a diagnosis. Neurological: Negative for headaches, focal weakness or numbness.  10-point ROS otherwise negative.  ____________________________________________   PHYSICAL EXAM:  VITAL SIGNS: ED Triage Vitals  Enc Vitals Group     BP 07/25/16 1242 (!) 150/83     Pulse Rate 07/25/16 1242 84     Resp 07/25/16 1242 20     Temp 07/25/16 1242 97.5 F (36.4 C)     Temp Source 07/25/16 1242 Oral     SpO2 07/25/16 1242 98 %     Weight 07/25/16 1243 145 lb (65.8 kg)     Height 07/25/16 1243 5\' 5"  (1.651 m)     Head Circumference --      Peak Flow --      Pain Score --      Pain Loc --      Pain Edu? --      Excl. in GC? --     Constitutional: Alert and oriented. Well appearing and in no acute distress. Eyes: Conjunctivae are normal. PERRL. EOMI. Head: Atraumatic. Nose: No congestion/rhinnorhea. Mouth/Throat: Mucous membranes are moist.  Oropharynx non-erythematous. Neck: No stridor.   Cardiovascular: Normal rate, regular rhythm. Grossly normal heart sounds.  Good peripheral circulation. Respiratory: Normal respiratory effort.  No retractions. Lungs CTAB. Gastrointestinal: Soft and nontender. No distention.  Musculoskeletal: Lower Extremities  No edema. Normal DP/PT pulses bilateral with good cap refill.  Normal neuro-motor function lower extremities bilateral, strong dorsi and plantar flexion. Good use of the hips and knees bilaterally. Patient reports decreased sensation in a moderate stocking glove type distribution over both feet, but does not have complete loss of sensation noted in any one area.  RIGHT Right lower extremity demonstrates normal strength, good use of all  muscles. No edema bruising or contusions of the right hip, right knee, right ankle. Full range of motion of the right lower extremity without pain. No pain on axial loading. No evidence of trauma.  LEFT Left lower extremity demonstrates normal strength, good use of all muscles. No edema bruising or contusions of the hip,  knee, ankle. Full range of motion of the left lower extremity without pain. No pain on axial loading. No evidence of trauma.  Neurologic:  Normal speech and language. No gross focal neurologic deficits are appreciated. Normal cranial nerve exam. Normal speech. 5 out of 5 strength in all extremities. No pronator drift No gait instability. Skin:  Skin is warm, dry and intact. No rash noted. Psychiatric: Mood and affect are normal. Speech and behavior are normal.  ____________________________________________   LABS (all labs ordered are listed, but only  abnormal results are displayed)  Labs Reviewed  URINALYSIS, COMPLETE (UACMP) WITH MICROSCOPIC - Abnormal; Notable for the following:       Result Value   Color, Urine AMBER (*)    APPearance TURBID (*)    Hgb urine dipstick MODERATE (*)    Ketones, ur 5 (*)    Protein, ur 100 (*)    Leukocytes, UA MODERATE (*)    Bacteria, UA MANY (*)    Squamous Epithelial / LPF 6-30 (*)    All other components within normal limits  BASIC METABOLIC PANEL - Abnormal; Notable for the following:    Potassium 3.4 (*)    Glucose, Bld 169 (*)    Creatinine, Ser 1.08 (*)    GFR calc non Af Amer 48 (*)    GFR calc Af Amer 56 (*)    All other components within normal limits  CBC - Abnormal; Notable for the following:    RDW 15.9 (*)    All other components within normal limits   ____________________________________________  EKG   ____________________________________________  RADIOLOGY   ____________________________________________   PROCEDURES  Procedure(s) performed: None  Procedures  Critical Care performed:  No  ____________________________________________   INITIAL IMPRESSION / ASSESSMENT AND PLAN / ED COURSE  Pertinent labs & imaging results that were available during my care of the patient were reviewed by me and considered in my medical decision making (see chart for details).  Patient presents for numbness progressive in the feet bilateral. Not associated muscular weakness. No infectious symptoms recently other than feeling that she may have a "UTI" based on changes in her urine noted by her caretaker. Her clinical examination seems to fit mostly that of peripheral neuropathy, and I find no evidence of progressive motor disease or guillian-barr. She appears quite stable at this time, and I suspect she may be developing a peripheral neuropathy. I discussed with the patient and recommended she follow up with neurology, and she is agreeable with this plan.  She does not report any associated back pain, has good motor examination with no deficits noted from the ankles proximal.  In addition the patient's urine is very suspicious for urinary tract infection. We'll treat with single dose fosfomycin, and advise close primary follow-up this week. Patient is agreeable  Return precautions and treatment recommendations and follow-up discussed with the patient who is agreeable with the plan.   Clinical Course      ____________________________________________   FINAL CLINICAL IMPRESSION(S) / ED DIAGNOSES  Final diagnoses:  Foot pain, bilateral      NEW MEDICATIONS STARTED DURING THIS VISIT:  Discharge Medication List as of 07/25/2016  5:59 PM       Note:  This document was prepared using Dragon voice recognition software and may include unintentional dictation errors.     Sharyn CreamerMark Rodolfo Notaro, MD 07/26/16 [phone]

## 2016-07-25 NOTE — Discharge Instructions (Signed)
Call your regular doctor to schedule the next available appointment to follow up on today?s ED visit, or return immediately to the ED if your pain worsens, you have decreased urine production, develop fever, persistent vomiting, or other symptoms that concern you.    Call your doctor or return to the ED if you have a headache, sudden and severe headache, confusion, slurred speech, facial droop, new weakness or numbness in any arm or leg, extreme fatigue, vision problems, or other symptoms that concern you.

## 2016-07-27 ENCOUNTER — Observation Stay
Admission: EM | Admit: 2016-07-27 | Discharge: 2016-07-31 | Payer: Medicare Other | Attending: Internal Medicine | Admitting: Internal Medicine

## 2016-07-27 ENCOUNTER — Encounter: Payer: Self-pay | Admitting: *Deleted

## 2016-07-27 DIAGNOSIS — G9340 Encephalopathy, unspecified: Secondary | ICD-10-CM | POA: Insufficient documentation

## 2016-07-27 DIAGNOSIS — E039 Hypothyroidism, unspecified: Secondary | ICD-10-CM | POA: Diagnosis not present

## 2016-07-27 DIAGNOSIS — Z9114 Patient's other noncompliance with medication regimen: Secondary | ICD-10-CM | POA: Insufficient documentation

## 2016-07-27 DIAGNOSIS — R2681 Unsteadiness on feet: Secondary | ICD-10-CM | POA: Insufficient documentation

## 2016-07-27 DIAGNOSIS — N183 Chronic kidney disease, stage 3 (moderate): Secondary | ICD-10-CM | POA: Insufficient documentation

## 2016-07-27 DIAGNOSIS — R410 Disorientation, unspecified: Secondary | ICD-10-CM

## 2016-07-27 DIAGNOSIS — R4182 Altered mental status, unspecified: Secondary | ICD-10-CM

## 2016-07-27 DIAGNOSIS — F0281 Dementia in other diseases classified elsewhere with behavioral disturbance: Secondary | ICD-10-CM | POA: Diagnosis not present

## 2016-07-27 DIAGNOSIS — F068 Other specified mental disorders due to known physiological condition: Secondary | ICD-10-CM

## 2016-07-27 DIAGNOSIS — F02B18 Dementia in other diseases classified elsewhere, moderate, with other behavioral disturbance: Secondary | ICD-10-CM

## 2016-07-27 DIAGNOSIS — E538 Deficiency of other specified B group vitamins: Secondary | ICD-10-CM | POA: Insufficient documentation

## 2016-07-27 DIAGNOSIS — N390 Urinary tract infection, site not specified: Secondary | ICD-10-CM | POA: Diagnosis present

## 2016-07-27 DIAGNOSIS — I679 Cerebrovascular disease, unspecified: Secondary | ICD-10-CM | POA: Insufficient documentation

## 2016-07-27 DIAGNOSIS — B952 Enterococcus as the cause of diseases classified elsewhere: Secondary | ICD-10-CM | POA: Insufficient documentation

## 2016-07-27 DIAGNOSIS — R262 Difficulty in walking, not elsewhere classified: Secondary | ICD-10-CM

## 2016-07-27 DIAGNOSIS — F22 Delusional disorders: Secondary | ICD-10-CM | POA: Insufficient documentation

## 2016-07-27 DIAGNOSIS — Z8674 Personal history of sudden cardiac arrest: Secondary | ICD-10-CM | POA: Insufficient documentation

## 2016-07-27 DIAGNOSIS — R21 Rash and other nonspecific skin eruption: Secondary | ICD-10-CM | POA: Diagnosis not present

## 2016-07-27 DIAGNOSIS — N3001 Acute cystitis with hematuria: Principal | ICD-10-CM | POA: Insufficient documentation

## 2016-07-27 LAB — BASIC METABOLIC PANEL
ANION GAP: 7 (ref 5–15)
BUN: 24 mg/dL — ABNORMAL HIGH (ref 6–20)
CALCIUM: 8.9 mg/dL (ref 8.9–10.3)
CO2: 30 mmol/L (ref 22–32)
CREATININE: 1.23 mg/dL — AB (ref 0.44–1.00)
Chloride: 105 mmol/L (ref 101–111)
GFR, EST AFRICAN AMERICAN: 48 mL/min — AB (ref >60)
GFR, EST NON AFRICAN AMERICAN: 41 mL/min — AB (ref >60)
Glucose, Bld: 112 mg/dL — ABNORMAL HIGH (ref 65–99)
Potassium: 3.7 mmol/L (ref 3.5–5.1)
SODIUM: 142 mmol/L (ref 135–145)

## 2016-07-27 LAB — URINALYSIS, COMPLETE (UACMP) WITH MICROSCOPIC
BILIRUBIN URINE: NEGATIVE
GLUCOSE, UA: NEGATIVE mg/dL
Ketones, ur: 5 mg/dL — AB
NITRITE: NEGATIVE
Protein, ur: 100 mg/dL — AB
SPECIFIC GRAVITY, URINE: 1.021 (ref 1.005–1.030)
pH: 6 (ref 5.0–8.0)

## 2016-07-27 LAB — CBC WITH DIFFERENTIAL/PLATELET
BASOS ABS: 0.1 10*3/uL (ref 0–0.1)
BASOS PCT: 1 %
EOS ABS: 0.1 10*3/uL (ref 0–0.7)
Eosinophils Relative: 3 %
HEMATOCRIT: 36.9 % (ref 35.0–47.0)
HEMOGLOBIN: 12.5 g/dL (ref 12.0–16.0)
Lymphocytes Relative: 22 %
Lymphs Abs: 1.2 10*3/uL (ref 1.0–3.6)
MCH: 29.4 pg (ref 26.0–34.0)
MCHC: 34 g/dL (ref 32.0–36.0)
MCV: 86.5 fL (ref 80.0–100.0)
MONOS PCT: 8 %
Monocytes Absolute: 0.5 10*3/uL (ref 0.2–0.9)
NEUTROS ABS: 3.7 10*3/uL (ref 1.4–6.5)
NEUTROS PCT: 66 %
Platelets: 207 10*3/uL (ref 150–440)
RBC: 4.27 MIL/uL (ref 3.80–5.20)
RDW: 15.9 % — AB (ref 11.5–14.5)
WBC: 5.6 10*3/uL (ref 3.6–11.0)

## 2016-07-27 MED ORDER — ACETAMINOPHEN 325 MG PO TABS
650.0000 mg | ORAL_TABLET | Freq: Four times a day (QID) | ORAL | Status: DC | PRN
  Administered 2016-07-29: 650 mg via ORAL
  Filled 2016-07-27: qty 2

## 2016-07-27 MED ORDER — POTASSIUM CHLORIDE CRYS ER 10 MEQ PO TBCR
10.0000 meq | EXTENDED_RELEASE_TABLET | Freq: Every day | ORAL | Status: DC
  Administered 2016-07-27 – 2016-07-31 (×5): 10 meq via ORAL
  Filled 2016-07-27 (×9): qty 1

## 2016-07-27 MED ORDER — HYDROCORTISONE VALERATE 0.2 % EX CREA
TOPICAL_CREAM | Freq: Two times a day (BID) | CUTANEOUS | Status: DC
  Administered 2016-07-27 – 2016-07-31 (×9): via TOPICAL
  Filled 2016-07-27: qty 15

## 2016-07-27 MED ORDER — SODIUM CHLORIDE 0.9 % IV BOLUS (SEPSIS)
1000.0000 mL | Freq: Once | INTRAVENOUS | Status: AC
  Administered 2016-07-27: 1000 mL via INTRAVENOUS

## 2016-07-27 MED ORDER — SODIUM CHLORIDE 0.9 % IV SOLN
INTRAVENOUS | Status: AC
  Administered 2016-07-27: 1000 mL via INTRAVENOUS

## 2016-07-27 MED ORDER — DEXTROSE 5 % IV SOLN: 1.0000 g | INTRAVENOUS | Status: DC

## 2016-07-27 MED ORDER — LEVOTHYROXINE SODIUM 112 MCG PO TABS
112.0000 ug | ORAL_TABLET | Freq: Every day | ORAL | Status: DC
  Administered 2016-07-28: 112 ug via ORAL
  Filled 2016-07-27 (×2): qty 1

## 2016-07-27 MED ORDER — ONDANSETRON HCL 4 MG PO TABS: 4.0000 mg | ORAL_TABLET | Freq: Four times a day (QID) | ORAL | Status: DC | PRN

## 2016-07-27 MED ORDER — HALOPERIDOL 0.5 MG PO TABS
0.5000 mg | ORAL_TABLET | Freq: Three times a day (TID) | ORAL | Status: DC
  Administered 2016-07-27 – 2016-07-28 (×3): 0.5 mg via ORAL
  Filled 2016-07-27 (×3): qty 1

## 2016-07-27 MED ORDER — TRAMADOL HCL 50 MG PO TABS: 50.0000 mg | ORAL_TABLET | Freq: Two times a day (BID) | ORAL | Status: DC | PRN

## 2016-07-27 MED ORDER — CEFTRIAXONE SODIUM-DEXTROSE 1-3.74 GM-% IV SOLR
1.0000 g | INTRAVENOUS | Status: DC
  Administered 2016-07-28: 1 g via INTRAVENOUS
  Filled 2016-07-27: qty 50

## 2016-07-27 MED ORDER — CEFTRIAXONE SODIUM-DEXTROSE 1-3.74 GM-% IV SOLR
1.0000 g | Freq: Once | INTRAVENOUS | Status: AC
  Administered 2016-07-27: 1 g via INTRAVENOUS
  Filled 2016-07-27: qty 50

## 2016-07-27 MED ORDER — DEXTROSE 5 % IV SOLN: 1.0000 g | Freq: Once | INTRAVENOUS | Status: DC

## 2016-07-27 MED ORDER — ONDANSETRON HCL 4 MG/2ML IJ SOLN: 4.0000 mg | Freq: Four times a day (QID) | INTRAMUSCULAR | Status: DC | PRN

## 2016-07-27 MED ORDER — LEVOTHYROXINE SODIUM 150 MCG PO TABS
150.0000 ug | ORAL_TABLET | Freq: Every day | ORAL | Status: DC
  Filled 2016-07-27: qty 1

## 2016-07-27 MED ORDER — TRAMADOL HCL 50 MG PO TABS
50.0000 mg | ORAL_TABLET | Freq: Two times a day (BID) | ORAL | Status: DC
  Administered 2016-07-27: 50 mg via ORAL
  Filled 2016-07-27: qty 1

## 2016-07-27 MED ORDER — HYDROXYZINE HCL 10 MG/5ML PO SYRP
10.0000 mg | ORAL_SOLUTION | Freq: Three times a day (TID) | ORAL | Status: DC | PRN
Start: 2016-07-27 — End: 2016-07-28
  Filled 2016-07-27: qty 5

## 2016-07-27 MED ORDER — HYDROCORTISONE VALERATE 0.2 % EX OINT
TOPICAL_OINTMENT | Freq: Two times a day (BID) | CUTANEOUS | Status: DC
  Filled 2016-07-27: qty 15

## 2016-07-27 MED ORDER — HALOPERIDOL 5 MG PO TABS
5.0000 mg | ORAL_TABLET | Freq: Once | ORAL | Status: AC
  Administered 2016-07-27: 5 mg via ORAL
  Filled 2016-07-27: qty 1

## 2016-07-27 MED ORDER — ENOXAPARIN SODIUM 40 MG/0.4ML ~~LOC~~ SOLN
40.0000 mg | SUBCUTANEOUS | Status: DC
  Administered 2016-07-27 – 2016-07-30 (×4): 40 mg via SUBCUTANEOUS
  Filled 2016-07-27 (×4): qty 0.4

## 2016-07-27 MED ORDER — ACETAMINOPHEN 650 MG RE SUPP: 650.0000 mg | Freq: Four times a day (QID) | RECTAL | Status: DC | PRN

## 2016-07-27 MED ORDER — FAMOTIDINE 20 MG PO TABS
20.0000 mg | ORAL_TABLET | Freq: Every day | ORAL | Status: DC
  Administered 2016-07-27 – 2016-07-31 (×5): 20 mg via ORAL
  Filled 2016-07-27 (×5): qty 1

## 2016-07-27 NOTE — ED Notes (Signed)
Charge RN notified of delay in transport due to staffing.

## 2016-07-27 NOTE — Consult Note (Signed)
[place] [facility] Consult   Reason for Consult: Hallucinations Referring Physician:  Dr. 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 UTI (urinary tract infection) [N39.0] 07/27/2016  . 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 Encephalopathy [G93.40] 09/15/2015  . Urinary tract infection [N39.0] 09/15/2015  . 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 Hypothyroidism [E03.9] 07/08/2015  . Bladder mass [N32.89] 07/08/2015    Total Time spent with patient: 45 minutes  Subjective:    Identifying data. Ms. Marie Mullen is a 75 y.o. female with no past psychiatric history.  Chief complaint. "I don't know what you are talking about."  History of present illness. Information was obtained from the patient and the chart. The patient was admitted to medical floor for treatment of UTI. The chart on admission the patient was confused, driving around the parking lot, believing that there is a child somewhere, concern about poison in water, making phone calls to a plummer. She was agitated and uncooperative, tried to leave the hospital, and was placed on involuntary commitment. She has sitter now. The patient herself does not remember circumstances of her admission. She believes that she came to the hospital because of the numbness in her legs. She denies any unusual activities or distorted thinking. She denies any symptoms of depression, anxiety, or psychosis. She is not suicidal or homicidal. She does not use drugs or alcohol.  Initially, the patient was irritable and barely cooperative during my interview but warmed up and became rather talkative. She appears somewhat believing that people are trying to get into her house. She reportedly replaced the locks in the house, garage, and door frame. She believes that this is  her sister Juliann Pulse trying to keep her in the facility and take her house over. She believes that He is jealous of her because she married Kathy's first love.   Past psychiatric history. She was never hospitalized, taken any psychotropic medications, or attempted suicide.  Family psychiatric history. She believes that there was a kin of hers living in [place] with mental illness.   Social history. 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 She had a son who died at the age of 69 on the Halloween night. She lives independently, drives, and takes care of herself without difficulties when well. Her other sister, works for judge [employer] a has been supportive.   Risk to Self: Is patient at risk for suicide?: No Risk to Others:   Prior Inpatient Therapy:   Prior Outpatient Therapy:    Past Medical History:  Past Medical History:  Diagnosis Date  . Cardiac arrest ([place]) 1990   . Chronic kidney disease   . Complication of anesthesia    slow to wake up   . Eczema   . Headache   . Hypothyroidism   . Lower extremity edema   . Pneumonia    hx of   . Weakness of both legs    knees  . WPW (Wolff-Parkinson-White syndrome)     Past Surgical History:  Procedure Laterality Date  . ABDOMINAL HYSTERECTOMY    . BREAST ENHANCEMENT SURGERY Bilateral   . CATARACT EXTRACTION Bilateral   . CYSTOSCOPY W/ RETROGRADES Bilateral 12/28/2014   Procedure: CYSTOSCOPY WITH RETROGRADE PYELOGRAM;  Surgeon: Hollice Espy, MD;  Location: [facility] ORS;  Service:  Urology;  Laterality: Bilateral;  . heart mapping     . TRANSURETHRAL RESECTION OF BLADDER TUMOR N/A 12/28/2014   Procedure: TRANSURETHRAL RESECTION OF BLADDER TUMOR (TURBT);  Surgeon: Hollice Espy, MD;  Location: [facility] ORS;  Service: Urology;  Laterality: N/A;  . wpw N/A    Family History:  Family History  Problem Relation Age of Onset  . Diabetes Neg Hx     Social History:  History  Alcohol Use No      History  Drug Use No    Social History   Social History  . Marital status: Widowed    Spouse name: N/A  . Number of children: N/A  . Years of education: N/A   Social History Main Topics  . Smoking status: Never Smoker  . Smokeless tobacco: Never Used  . Alcohol use No  . Drug use: No  . Sexual activity: Not Asked   Other Topics Concern  . None   Social History Narrative  . None   Additional Social History:    Allergies:  No Known Allergies  Labs:  Results for orders placed or performed during the hospital encounter of 07/27/16 (from the past 48 hour(s))  Basic metabolic panel     Status: Abnormal   Collection Time: 07/27/16  4:40 AM  Result Value Ref Range   Sodium 142 135 - 145 mmol/L   Potassium 3.7 3.5 - 5.1 mmol/L   Chloride 105 101 - 111 mmol/L   CO2 30 22 - 32 mmol/L   Glucose, Bld 112 (H) 65 - 99 mg/dL   BUN 24 (H) 6 - 20 mg/dL   Creatinine, Ser 1.23 (H) 0.44 - 1.00 mg/dL   Calcium 8.9 8.9 - 10.3 mg/dL   GFR calc non Af Amer 41 (L) >60 mL/min   GFR calc Af Amer 48 (L) >60 mL/min    Comment: (NOTE) The eGFR has been calculated using the CKD EPI equation. This calculation has not been validated in all clinical situations. eGFR's persistently <60 mL/min signify possible Chronic Kidney Disease.    Anion gap 7 5 - 15  CBC with Differential     Status: Abnormal   Collection Time: 07/27/16  4:40 AM  Result Value Ref Range   WBC 5.6 3.6 - 11.0 K/uL   RBC 4.27 3.80 - 5.20 MIL/uL   Hemoglobin 12.5 12.0 - 16.0 g/dL   HCT 36.9 35.0 - 47.0 %   MCV 86.5 80.0 - 100.0 fL   MCH 29.4 26.0 - 34.0 pg   MCHC 34.0 32.0 - 36.0 g/dL   RDW 15.9 (H) 11.5 - 14.5 %   Platelets 207 150 - 440 K/uL   Neutrophils Relative % 66 %   Neutro Abs 3.7 1.4 - 6.5 K/uL   Lymphocytes Relative 22 %   Lymphs Abs 1.2 1.0 - 3.6 K/uL   Monocytes Relative 8 %   Monocytes Absolute 0.5 0.2 - 0.9 K/uL   Eosinophils Relative 3 %   Eosinophils Absolute 0.1 0 - 0.7 K/uL   Basophils Relative  1 %   Basophils Absolute 0.1 0 - 0.1 K/uL  Urinalysis, Complete w Microscopic     Status: Abnormal   Collection Time: 07/27/16  5:20 AM  Result Value Ref Range   Color, Urine AMBER (A) YELLOW    Comment: BIOCHEMICALS MAY BE AFFECTED BY COLOR   APPearance TURBID (A) CLEAR   Specific Gravity, Urine 1.021 1.005 - 1.030   pH 6.0 5.0 - 8.0   Glucose, UA  NEGATIVE NEGATIVE mg/dL   Hgb urine dipstick LARGE (A) NEGATIVE   Bilirubin Urine NEGATIVE NEGATIVE   Ketones, ur 5 (A) NEGATIVE mg/dL   Protein, ur 100 (A) NEGATIVE mg/dL   Nitrite NEGATIVE NEGATIVE   Leukocytes, UA LARGE (A) NEGATIVE   RBC / HPF TOO NUMEROUS TO COUNT 0 - 5 RBC/hpf   WBC, UA TOO NUMEROUS TO COUNT 0 - 5 WBC/hpf   Bacteria, UA MANY (A) NONE SEEN   Squamous Epithelial / LPF 6-30 (A) NONE SEEN   Mucous PRESENT     Current Facility-Administered Medications  Medication Dose Route Frequency Provider Last Rate Last Dose  . 0.9 %  sodium chloride infusion   Intravenous Continuous Dustin Flock, MD 75 mL/hr at 07/27/16 1236 1,000 mL at 07/27/16 1236  . acetaminophen (TYLENOL) tablet 650 mg  650 mg Oral Q6H PRN Dustin Flock, MD       Or  . acetaminophen (TYLENOL) suppository 650 mg  650 mg Rectal Q6H PRN Dustin Flock, MD      . Derrill Memo ON 07/28/2016] cefTRIAXone (ROCEPHIN) IVPB 1 g  1 g Intravenous Q24H Dustin Flock, MD      . enoxaparin (LOVENOX) injection 40 mg  40 mg Subcutaneous Q24H Dustin Flock, MD      . famotidine (PEPCID) tablet 20 mg  20 mg Oral Daily Dustin Flock, MD   20 mg at 07/27/16 1237  . haloperidol (HALDOL) tablet 0.5 mg  0.5 mg Oral TID Clovis Fredrickson, MD      . hydrocortisone valerate cream (WESTCORT) 0.2 %   Topical BID Dustin Flock, MD      . hydrOXYzine (ATARAX) 10 MG/5ML syrup 10 mg  10 mg Oral TID PRN Dustin Flock, MD      . Derrill Memo ON 07/28/2016] levothyroxine (SYNTHROID, LEVOTHROID) tablet 112 mcg  112 mcg Oral QAC breakfast Dustin Flock, MD      . ondansetron (ZOFRAN) tablet 4 mg   4 mg Oral Q6H PRN Dustin Flock, MD       Or  . ondansetron (ZOFRAN) injection 4 mg  4 mg Intravenous Q6H PRN Dustin Flock, MD      . potassium chloride (K-DUR) CR tablet 10 mEq  10 mEq Oral Daily Dustin Flock, MD   10 mEq at 07/27/16 1237  . traMADol (ULTRAM) tablet 50 mg  50 mg Oral Q12H PRN Dustin Flock, MD        Musculoskeletal: Strength & Muscle Tone: within normal limits Gait & Station: normal Patient leans: N/A  Psychiatric Specialty Exam: Physical Exam  Nursing note and vitals reviewed.   Review of Systems  Genitourinary: Positive for urgency.  All other systems reviewed and are negative.   Blood pressure (!) 152/82, pulse 67, temperature 98 F (36.7 C), temperature source Oral, resp. rate 18, height 5' 5" (1.651 m), weight 65.8 kg (145 lb), SpO2 98 %.Body mass index is 24.13 kg/m.  General Appearance: Casual  Eye Contact:  Good  Speech:  Clear and Coherent  Volume:  Normal  Mood:  Dysphoric  Affect:  Appropriate  Thought Process:  Goal Directed and Descriptions of Associations: Intact  Orientation:  Full (Time, Place, and Person)  Thought Content:  Delusions and Paranoid Ideation  Suicidal Thoughts:  No  Homicidal Thoughts:  No  Memory:  Immediate;   Fair Recent;   Fair Remote;   Fair  Judgement:  Impaired  Insight:  Shallow  Psychomotor Activity:  Normal  Concentration:  Concentration: Fair and Attention Span: Fair  Recall:  Smiley Houseman of Knowledge:  Fair  Language:  Fair  Akathisia:  No  Handed:  Right  AIMS (if indicated):     Assets:  Communication Skills Desire for Improvement Financial Resources/Insurance [place] Support Talents/Skills Transportation Vocational/Educational  ADL's:  Intact  Cognition:  WNL  Sleep:        Treatment Plan Summary: Daily contact with patient to assess and evaluate symptoms and progress in treatment and Medication management   PLAN: 1. I will start Haldol 0.5 mg tid  for delirium that is resolving.  2. Spoke with her nurse. The patient is difficult to redirect at times. We will keep IVC and sitter till tomorrow.  3. I will let Dr. Jerilee Hoh, on call tomorrow, to know assess the patient.  Disposition: No evidence of imminent risk to self or others at present.   Patient does not meet criteria for psychiatric inpatient admission. Supportive therapy provided about ongoing stressors. Discussed crisis plan, support from social network, calling 911, coming to the Emergency Department, and calling Suicide Hotline.  Orson Slick, MD 07/27/2016 3:41 PM

## 2016-07-27 NOTE — Progress Notes (Signed)
Last known Synthroid dose was 112 g I will change that and check a TSH

## 2016-07-27 NOTE — ED Notes (Signed)
Pt oob to BR with cane and 1 assist °

## 2016-07-27 NOTE — ED Triage Notes (Signed)
Pt found driving vehicle slowly thru the hospital parking lot with no lights; stopped pt to inquire if she needed assistance; pt st she was here in the hospital yesterday and is on her way home, though the d/c papers indicate she was seen 12/6 for UTI; pt st that her water is contaminated and it is messing her skin up and she has a plumber coming out to check it; also indicates that she has no heat in her house and lives alone; pt agrees to come inside ED to be evaluated and st "well they did tell me to come back today"
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
## 2016-07-27 NOTE — ED Notes (Signed)
Pt provided warm blanket by Kennedy Buckerhanh, EDT

## 2016-07-27 NOTE — ED Notes (Signed)
Pt ambulated with cane to toilet.
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
## 2016-07-27 NOTE — H&P (Signed)
[facility] - [place] [facility]   PATIENT NAME: Marie Mullen    MR#:  [id number]  DATE OF BIRTH:  04/27/1941  DATE OF ADMISSION:  07/27/2016  PRIMARY CARE PHYSICIAN: No PCP Per Patient   REQUESTING/REFERRING PHYSICIAN: Sharman CheekPhillip Stafford  CHIEF COMPLAINT:   Chief Complaint  Patient presents with  . Altered Mental Status    HISTORY OF PRESENT ILLNESS: Marie Aliasolly Hollon  is a 75 y.o. 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 Patient reports that they did not give her any prescription for antibiotics. She was found driving her car in the circles around the parking lot. She denies any fevers or chills. Reports burning with urination. Patient is paranoid states that she has this itchy rash and feels that somebody is poisoning her. She reports that her plumber has told her that there may be something in her water. She states that she called her Plummer this morning to come see her. She also reports that she was seeing Dr. Dareen PianoAnderson in the past but was told not to see him again because his nurse stated that she was calling him 7-8 times a day.    PAST MEDICAL HISTORY:   Past Medical History:  Diagnosis Date  . Cardiac arrest (HCC) 1990   . Chronic kidney disease   . Complication of anesthesia    slow to wake up   . Eczema   . Headache   . Hypothyroidism   . Lower extremity edema   . Pneumonia    hx of   . Weakness of both legs    knees  . WPW (Wolff-Parkinson-White syndrome)     PAST SURGICAL HISTORY: Past Surgical History:  Procedure Laterality Date  . ABDOMINAL HYSTERECTOMY    . BREAST ENHANCEMENT SURGERY Bilateral   . CATARACT EXTRACTION Bilateral   . CYSTOSCOPY W/ RETROGRADES Bilateral 12/28/2014    Procedure: CYSTOSCOPY WITH RETROGRADE PYELOGRAM;  Surgeon: Vanna ScotlandAshley Brandon, MD;  Location: [facility] ORS;  Service: Urology;  Laterality: Bilateral;  . heart mapping     . TRANSURETHRAL RESECTION OF BLADDER TUMOR N/A 12/28/2014   Procedure: TRANSURETHRAL RESECTION OF BLADDER TUMOR (TURBT);  Surgeon: Vanna ScotlandAshley Brandon, MD;  Location: [facility] ORS;  Service: Urology;  Laterality: N/A;  . wpw N/A     SOCIAL HISTORY:  Social History  Substance Use Topics  . Smoking status: Never Smoker  . Smokeless tobacco: Never Used  . Alcohol use No    FAMILY HISTORY:  Family History  Problem Relation Age of Onset  . Diabetes Neg Hx     DRUG ALLERGIES: No Known Allergies  REVIEW OF SYSTEMS:   CONSTITUTIONAL: No fever, fatigue or weakness.  EYES: No blurred or double vision.  EARS, NOSE, AND THROAT: No tinnitus or ear pain.  RESPIRATORY: No cough, shortness of breath, wheezing or hemoptysis.  CARDIOVASCULAR: No chest pain, orthopnea, edema.  GASTROINTESTINAL: No nausea, vomiting, diarrhea or abdominal pain.  GENITOURINARY: Positive dysuria, hematuria.  ENDOCRINE: No polyuria, nocturia,  HEMATOLOGY: No anemia, easy bruising or bleeding SKIN: No rash or lesion. MUSCULOSKELETAL: No joint pain or arthritis.   NEUROLOGIC: No tingling, numbness, weakness.  PSYCHIATRY: No anxiety or depression.   MEDICATIONS AT HOME:  Prior to Admission medications   Medication Sig Start  Date End Date Taking? Authorizing Provider  famotidine (PEPCID) 20 MG tablet Take 1 tablet (20 mg total) by mouth daily. 03/29/16 04/05/16  Jami L Hagler, PA-C  hydrocortisone valerate ointment (WESTCORT) 0.2 % Apply to affected area daily 05/03/16 05/03/17  Joni Reiningonald K Smith, PA-C  hydrOXYzine (ATARAX) 10 MG/5ML syrup Take 5 mLs (10 mg total) by mouth 3 (three) times daily as needed for itching. 05/03/16   Joni Reiningonald K Smith, PA-C  levothyroxine (SYNTHROID, LEVOTHROID) 150 MCG tablet Take 150 mcg by mouth daily before breakfast.    Historical Provider,  MD  potassium chloride (K-DUR) 10 MEQ tablet Take 10 mEq by mouth daily.    Historical Provider, MD  traMADol (ULTRAM) 50 MG tablet Take 1 tablet (50 mg total) by mouth 2 (two) times daily. 03/20/16   Jenise V Bacon Menshew, PA-C      PHYSICAL EXAMINATION:   VITAL SIGNS: Blood pressure (!) 159/85, pulse 70, temperature 98.3 F (36.8 C), temperature source Oral, resp. rate 18, height 5\' 5"  (1.651 m), weight 145 lb (65.8 kg), SpO2 99 %.  GENERAL:  75 y.o.-year-old patient lying in the bed with no acute distress.  EYES: Pupils equal, round, reactive to light and accommodation. No scleral icterus. Extraocular muscles intact.  HEENT: Head atraumatic, normocephalic. Oropharynx and nasopharynx clear.  NECK:  Supple, no jugular venous distention. No thyroid enlargement, no tenderness.  LUNGS: Normal breath sounds bilaterally, no wheezing, rales,rhonchi or crepitation. No use of accessory muscles of respiration.  CARDIOVASCULAR: S1, S2 normal. No murmurs, rubs, or gallops.  ABDOMEN: Soft, nontender, nondistended. Bowel sounds present. No organomegaly or mass.  EXTREMITIES: No pedal edema, cyanosis, or clubbing.  NEUROLOGIC: Cranial nerves II through XII are intact. Muscle strength 5/5 in all extremities. Sensation intact. Gait not checked.  PSYCHIATRIC: The patient is alert and oriented x 3. But has paranoid thinking SKIN: Scaly rash on her arms and other parts of the body which appears to be from itching and dermatitis  LABORATORY PANEL:   CBC  Recent Labs Lab 07/25/16 1249 07/27/16 0440  WBC 7.3 5.6  HGB 13.7 12.5  HCT 41.0 36.9  PLT 215 207  MCV 85.8 86.5  MCH 28.7 29.4  MCHC 33.5 34.0  RDW 15.9* 15.9*  LYMPHSABS  --  1.2  MONOABS  --  0.5  EOSABS  --  0.1  BASOSABS  --  0.1   ------------------------------------------------------------------------------------------------------------------  Chemistries   Recent Labs Lab 07/25/16 1249 07/27/16 0440  NA 139 142  K 3.4* 3.7   CL 103 105  CO2 28 30  GLUCOSE 169* 112*  BUN 19 24*  CREATININE 1.08* 1.23*  CALCIUM 9.1 8.9   ------------------------------------------------------------------------------------------------------------------ estimated creatinine clearance is 34.5 mL/min (by C-G formula based on SCr of 1.23 mg/dL (H)). ------------------------------------------------------------------------------------------------------------------ No results for input(s): TSH, T4TOTAL, T3FREE, THYROIDAB in the last 72 hours.  Invalid input(s): FREET3   Coagulation profile No results for input(s): INR, PROTIME in the last 168 hours. ------------------------------------------------------------------------------------------------------------------- No results for input(s): DDIMER in the last 72 hours. -------------------------------------------------------------------------------------------------------------------  Cardiac Enzymes No results for input(s): CKMB, TROPONINI, MYOGLOBIN in the last 168 hours.  Invalid input(s): CK ------------------------------------------------------------------------------------------------------------------ Invalid input(s): POCBNP  ---------------------------------------------------------------------------------------------------------------  Urinalysis    Component Value Date/Time   COLORURINE AMBER (A) 07/27/2016 0520   APPEARANCEUR TURBID (A) 07/27/2016 0520   LABSPEC 1.021 07/27/2016 0520   PHURINE 6.0 07/27/2016 0520   GLUCOSEU NEGATIVE 07/27/2016 0520   HGBUR LARGE (A) 07/27/2016 0520   BILIRUBINUR NEGATIVE 07/27/2016 0520   Lavenia AtlasKETONESUR  5 (A) 07/27/2016 0520   PROTEINUR 100 (A) 07/27/2016 0520   NITRITE NEGATIVE 07/27/2016 0520   LEUKOCYTESUR LARGE (A) 07/27/2016 0520     RADIOLOGY: No results found.  EKG: Orders placed or performed during the hospital encounter of 04/03/16  . ED EKG  . ED EKG  . EKG    IMPRESSION AND PLAN: Patient is a 75 year old  presenting with paranoia recurrent urinary tract infection  1. Recurrent UTI I will place her on IV ceftriaxone and follow her urine cultures  2. Paranoia and delusional thinking no psychiatric history I will ask psychiatry to see  3. Hypothyroidism continue Synthroid  4. Chronic eczema which she sees a dermatologist for Continue steroid cream  5. Miscellaneous Lovenox   All the records are reviewed and case discussed with ED provider. Management plans discussed with the patient, family and they are in agreement.  CODE STATUS: Code Status History    Date Active Date Inactive Code Status Order ID Comments User Context   09/15/2015  9:07 PM 09/20/2015 12:16 AM Full Code [id number]  Wyatt Haste, MD ED   07/06/2015  5:32 PM 07/09/2015  6:20 PM Full Code [id number]  Ramonita Lab, MD Inpatient       TOTAL TIME TAKING CARE OF THIS PATIENT: .    Auburn Bilberry M.D on 07/27/2016 at 7:56 AM  Between 7am to 6pm - Pager - [phone]  After 6pm go to [email or web address] - password [email or web address] [facility]  [facility]  Office  [phone]  CC: Primary care physician; No PCP Per Patient

## 2016-07-28 DIAGNOSIS — R410 Disorientation, unspecified: Secondary | ICD-10-CM

## 2016-07-28 DIAGNOSIS — N3001 Acute cystitis with hematuria: Secondary | ICD-10-CM | POA: Diagnosis not present

## 2016-07-28 DIAGNOSIS — F02B18 Dementia in other diseases classified elsewhere, moderate, with other behavioral disturbance: Secondary | ICD-10-CM

## 2016-07-28 DIAGNOSIS — F0281 Dementia in other diseases classified elsewhere with behavioral disturbance: Secondary | ICD-10-CM

## 2016-07-28 DIAGNOSIS — I679 Cerebrovascular disease, unspecified: Secondary | ICD-10-CM

## 2016-07-28 LAB — BASIC METABOLIC PANEL
ANION GAP: 4 — AB (ref 5–15)
BUN: 20 mg/dL (ref 6–20)
CALCIUM: 8.3 mg/dL — AB (ref 8.9–10.3)
CHLORIDE: 108 mmol/L (ref 101–111)
CO2: 29 mmol/L (ref 22–32)
CREATININE: 0.99 mg/dL (ref 0.44–1.00)
GFR calc non Af Amer: 54 mL/min — ABNORMAL LOW (ref >60)
Glucose, Bld: 104 mg/dL — ABNORMAL HIGH (ref 65–99)
Potassium: 4.2 mmol/L (ref 3.5–5.1)
SODIUM: 141 mmol/L (ref 135–145)

## 2016-07-28 LAB — CBC
HCT: 35.1 % (ref 35.0–47.0)
HEMOGLOBIN: 12 g/dL (ref 12.0–16.0)
MCH: 29.3 pg (ref 26.0–34.0)
MCHC: 34.3 g/dL (ref 32.0–36.0)
MCV: 85.6 fL (ref 80.0–100.0)
Platelets: 186 10*3/uL (ref 150–440)
RBC: 4.1 MIL/uL (ref 3.80–5.20)
RDW: 15.7 % — AB (ref 11.5–14.5)
WBC: 4.6 10*3/uL (ref 3.6–11.0)

## 2016-07-28 LAB — THYROID PANEL WITH TSH
FREE THYROXINE INDEX: 0.3 — AB (ref 1.2–4.9)
T3 UPTAKE RATIO: 19 % — AB (ref 24–39)
T4, Total: 1.6 ug/dL — ABNORMAL LOW (ref 4.5–12.0)
TSH: 183.8 u[IU]/mL — ABNORMAL HIGH (ref 0.450–4.500)

## 2016-07-28 LAB — RAPID HIV SCREEN (HIV 1/2 AB+AG)
HIV 1/2 ANTIBODIES: NONREACTIVE
HIV-1 P24 ANTIGEN - HIV24: NONREACTIVE

## 2016-07-28 LAB — VITAMIN B12: Vitamin B-12: 281 pg/mL (ref 180–914)

## 2016-07-28 MED ORDER — LEVOTHYROXINE SODIUM 75 MCG PO TABS: 150.0000 ug | ORAL_TABLET | Freq: Every day | ORAL | Status: DC

## 2016-07-28 MED ORDER — SODIUM CHLORIDE 0.9 % IV SOLN
1.0000 g | Freq: Four times a day (QID) | INTRAVENOUS | Status: DC
  Administered 2016-07-28 – 2016-07-29 (×4): 1 g via INTRAVENOUS
  Filled 2016-07-28 (×7): qty 1000

## 2016-07-28 MED ORDER — HALOPERIDOL 0.5 MG PO TABS
0.5000 mg | ORAL_TABLET | Freq: Four times a day (QID) | ORAL | Status: DC
  Administered 2016-07-28 – 2016-07-31 (×12): 0.5 mg via ORAL
  Filled 2016-07-28 (×12): qty 1

## 2016-07-28 MED ORDER — LEVOTHYROXINE SODIUM 125 MCG PO TABS
125.0000 ug | ORAL_TABLET | Freq: Every day | ORAL | Status: DC
  Administered 2016-07-29 – 2016-07-31 (×3): 125 ug via ORAL
  Filled 2016-07-28 (×3): qty 1

## 2016-07-28 NOTE — Progress Notes (Signed)
Patient ID: Marie Mullen, female   DOB: 19-Apr-1941, 75 y.o.   MRN: [id number]  [facility] PROGRESS NOTE  Marie Mullen [id number] DOB: 19-Apr-1941 DOA: 07/27/2016 PCP: No PCP Per Patient  HPI/Subjective: Patient wants to go home. She's feeling well and offers no complaints. She states that her Tollie Ethlummer who replaced her sink states that the stainless steel should not rust out. She's had her water tested by the city units water quality at the street is okay. She states that her skin lesions are from showering with the water at her house.  Objective: Vitals:   07/28/16 1412 07/28/16 1414  BP: 138/76 (!) 158/76  Pulse: 68 72  Resp: 16 16  Temp:      Filed Weights   07/27/16 0326  Weight: 65.8 kg (145 lb)    ROS: Review of Systems  Constitutional: Negative for chills and fever.  Eyes: Negative for blurred vision.  Respiratory: Negative for cough and shortness of breath.   Cardiovascular: Negative for chest pain.  Gastrointestinal: Negative for abdominal pain, constipation, diarrhea, nausea and vomiting.  Genitourinary: Negative for dysuria.  Musculoskeletal: Negative for joint pain.  Skin: Positive for rash.  Neurological: Negative for dizziness and headaches.   Exam: Physical Exam  Constitutional: She is oriented to person, place, and time.  HENT:  Nose: No mucosal edema.  Mouth/Throat: No oropharyngeal exudate or posterior oropharyngeal edema.  Eyes: Conjunctivae, EOM and lids are normal. Pupils are equal, round, and reactive to light.  Neck: No JVD present. Carotid bruit is not present. No edema present. No thyroid mass and no thyromegaly present.  Cardiovascular: S1 normal and S2 normal.  Exam reveals no gallop.   No murmur heard. Pulses:      Dorsalis pedis pulses are 2+ on the right side, and 2+ on the left side.  Respiratory: No respiratory distress. She has no wheezes. She has no rhonchi. She has no rales.  GI: Soft. Bowel sounds are normal. There is no  tenderness.  Musculoskeletal:       Right ankle: She exhibits no swelling.       Left ankle: She exhibits no swelling.  Lymphadenopathy:    She has no cervical adenopathy.  Neurological: She is alert and oriented to person, place, and time. No cranial nerve deficit.  Skin: Skin is warm. Nails show no clubbing.  Patient skin has healing lesions. Could be secondary to scratching. No signs of infection  Psychiatric: She has a normal mood and affect. Thought content is paranoid.      Data Reviewed: Basic Metabolic Panel:  Recent Labs Lab 07/25/16 1249 07/27/16 0440 07/28/16 0634  NA 139 142 141  K 3.4* 3.7 4.2  CL 103 105 108  CO2 28 30 29   GLUCOSE 169* 112* 104*  BUN 19 24* 20  CREATININE 1.08* 1.23* 0.99  CALCIUM 9.1 8.9 8.3*  CBC:  Recent Labs Lab 07/25/16 1249 07/27/16 0440 07/28/16 0634  WBC 7.3 5.6 4.6  NEUTROABS  --  3.7  --   HGB 13.7 12.5 12.0  HCT 41.0 36.9 35.1  MCV 85.8 86.5 85.6  PLT 215 207 186     Recent Results (from the past 240 hour(s))  Urine culture     Status: Abnormal (Preliminary result)   Collection Time: 07/27/16  5:20 AM  Result Value Ref Range Status   Specimen Description URINE, RANDOM  Final   Special Requests NONE  Final   Culture >=100,000 COLONIES/mL ENTEROCOCCUS FAECIUM (A)  Final  Report Status PENDING  Incomplete      Scheduled Meds: . ampicillin (OMNIPEN) IV  1 g Intravenous Q6H  . enoxaparin (LOVENOX) injection  40 mg Subcutaneous Q24H  . famotidine  20 mg Oral Daily  . haloperidol  0.5 mg Oral QID  . hydrocortisone valerate cream   Topical BID  . [START ON 07/29/2016] levothyroxine  150 mcg Oral QAC breakfast  . potassium chloride  10 mEq Oral Daily    Assessment/Plan:  1. Acute delirium with paranoid thinking. Case  Discussed with psychiatry. They're continuing the IVC.dementia workup with MRI of the Brain, RPR, vitamin B12 level. patient currently on Haldol 2. Severe hypothyroidism. Noncompliance with  medications.   Restart levothyroxine. 3. Enterococcus UTI. Switch antibiotics to ampicillin. 4. Skin rash. Antibiotic should help. 5. Send heavy metal profile 6. As nursing staff to ambulate 7. Chronic kidney disease stage III   Code Status:     Code Status Orders        Start     Ordered   07/27/16 1053  Full code  Continuous     07/27/16 1052    Code Status History    Date Active Date Inactive Code Status Order ID Comments User Context   09/15/2015  9:07 PM 09/20/2015 12:16 AM Full Code [id number]  Wyatt Hasteavid K Hower, MD ED   07/06/2015  5:32 PM 07/09/2015  6:20 PM Full Code [id number]  Ramonita LabAruna Gouru, MD Inpatient     Disposition Plan: the patient is in IVC. I'm unable to make a decision at this point in time  Consultants:  psychiatry  Antibiotics:  Ampicillin  Time spent: 28 minutes  Alford HighlandWIETING, Shauniece Kwan  [employer]

## 2016-07-28 NOTE — Plan of Care (Signed)
Problem: Safety: Goal: Ability to remain free from injury will improve Outcome: Progressing Educated patient on the need to call for assistance  Before trying to get out of the bed

## 2016-07-28 NOTE — Consult Note (Signed)
[place] [facility] Consult   Reason for Consult: Hallucinations Referring Physician:  Dr. Posey Pronto Patient Identification: Marie Mullen MRN:  [id number] Principal Diagnosis: Acute delirium Diagnosis:   Patient Active Problem List   Diagnosis Date Noted  . Acute delirium [R41.0] 07/28/2016  . 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 Cerebrovascular disease [I67.9] 07/28/2016  . UTI (urinary tract infection) [N39.0] 07/27/2016  . Encephalopathy [G93.40] 09/15/2015  . Urinary tract infection [N39.0] 09/15/2015  . 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 Hypothyroidism [E03.9] 07/08/2015  . Bladder mass [N32.89] 07/08/2015    Total Time spent with patient: 45 minutes  Subjective:    Identifying data. Marie Mullen is a 75 y.o. female with no past psychiatric history.  Chief complaint. "I don't know what you are talking about."  12/08 The patient was admitted to medical floor for treatment of UTI. The chart on admission the patient was confused, driving around the parking lot, believing that there is a child somewhere, concern about poison in water, making phone calls to a plummer. She was agitated and uncooperative, tried to leave the hospital, and was placed on involuntary commitment. She has sitter now. The patient herself does not remember circumstances of her admission. She believes that she came to the hospital because of the numbness in her legs. She denies any unusual activities or distorted thinking. She denies any symptoms of depression, anxiety, or psychosis. She is not suicidal or homicidal. She does not use drugs or alcohol.  Initially, the patient was irritable and barely cooperative during my interview but warmed up and became rather talkative. She appears somewhat believing that people are trying to get into her house. She reportedly replaced the  locks in the house, garage, and door frame. She believes that this is her sister Juliann Pulse trying to keep her in the facility and take her house over. She believes that He is jealous of her because she married Kathy's first love.    12/09 I interviewed the patient today. She was at times tangential and required redirection during the interview. She continues to be paranoid thinking that her sister is stealing money from her. Says that her niece is a Conservation officer, historic buildings who was in federal prison. She talks about people plotting against her. She talks about her water being poisoned. Patient denies suicidality, homicidality or auditory or visual hallucinations. She also denies problems with depression or anxiety. She was oriented to person and to date. She knew she was in a hospital but wasn't sure of his name even though she has lived [place] all of her life. She was not oriented to the circumstances leading to her admission.  Past psychiatric history. She was never hospitalized, taken any psychotropic medications, or attempted suicide.  Family psychiatric history. She believes that there was a kin of hers living in [place] with mental illness.   Social history. 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 She had a son who died at the age of 1 on the Halloween night. She lives independently, drives, and takes care of herself without difficulties when well. Her other sister, works for judge [employer] a has been supportive.   Risk to Self: Is patient at risk for suicide?: No Risk to Others:  no Prior Inpatient Therapy:   Prior Outpatient Therapy:    Past Medical History:  Past Medical History:  Diagnosis Date  . Cardiac arrest ([place]) 1990   . Chronic kidney disease   . Complication of anesthesia    slow to wake up   . Eczema   . Headache   . Hypothyroidism   . Lower extremity edema   . Pneumonia    hx of   . Weakness of both legs    knees  . WPW  (Wolff-Parkinson-White syndrome)     Past Surgical History:  Procedure Laterality Date  . ABDOMINAL HYSTERECTOMY    . BREAST ENHANCEMENT SURGERY Bilateral   . CATARACT EXTRACTION Bilateral   . CYSTOSCOPY W/ RETROGRADES Bilateral 12/28/2014   Procedure: CYSTOSCOPY WITH RETROGRADE PYELOGRAM;  Surgeon: Hollice Espy, MD;  Location: [facility] ORS;  Service: Urology;  Laterality: Bilateral;  . heart mapping     . TRANSURETHRAL RESECTION OF BLADDER TUMOR N/A 12/28/2014   Procedure: TRANSURETHRAL RESECTION OF BLADDER TUMOR (TURBT);  Surgeon: Hollice Espy, MD;  Location: [facility] ORS;  Service: Urology;  Laterality: N/A;  . wpw N/A    Family History:  Family History  Problem Relation Age of Onset  . Diabetes Neg Hx     Social History:  History  Alcohol Use No     History  Drug Use No    Social History   Social History  . Marital status: Widowed    Spouse name: N/A  . Number of children: N/A  . Years of education: N/A   Social History Main Topics  . Smoking status: Never Smoker  . Smokeless tobacco: Never Used  . Alcohol use No  . Drug use: No  . Sexual activity: Not Asked   Other Topics Concern  . None   Social History Narrative  . None   Additional Social History:    Allergies:  No Known Allergies  Labs:  Results for orders placed or performed during the hospital encounter of 07/27/16 (from the past 48 hour(s))  Basic metabolic panel     Status: Abnormal   Collection Time: 07/27/16  4:40 AM  Result Value Ref Range   Sodium 142 135 - 145 mmol/L   Potassium 3.7 3.5 - 5.1 mmol/L   Chloride 105 101 - 111 mmol/L   CO2 30 22 - 32 mmol/L   Glucose, Bld 112 (H) 65 - 99 mg/dL   BUN 24 (H) 6 - 20 mg/dL   Creatinine, Ser 1.23 (H) 0.44 - 1.00 mg/dL   Calcium 8.9 8.9 - 10.3 mg/dL   GFR calc non Af Amer 41 (L) >60 mL/min   GFR calc Af Amer 48 (L) >60 mL/min    Comment: (NOTE) The eGFR has been calculated using the CKD EPI equation. This calculation has not been validated in  all clinical situations. eGFR's persistently <60 mL/min signify possible Chronic Kidney Disease.    Anion gap 7 5 - 15  CBC with Differential     Status: Abnormal   Collection Time: 07/27/16  4:40 AM  Result Value Ref Range   WBC 5.6 3.6 - 11.0 K/uL   RBC 4.27 3.80 - 5.20 MIL/uL   Hemoglobin 12.5 12.0 - 16.0 g/dL   HCT 36.9 35.0 - 47.0 %   MCV 86.5 80.0 - 100.0 fL   MCH 29.4 26.0 - 34.0 pg   MCHC 34.0 32.0 - 36.0 g/dL   RDW 15.9 (H) 11.5 - 14.5 %   Platelets 207 150 - 440 K/uL   Neutrophils Relative % 66 %   Neutro Abs 3.7 1.4 - 6.5 K/uL  Lymphocytes Relative 22 %   Lymphs Abs 1.2 1.0 - 3.6 K/uL   Monocytes Relative 8 %   Monocytes Absolute 0.5 0.2 - 0.9 K/uL   Eosinophils Relative 3 %   Eosinophils Absolute 0.1 0 - 0.7 K/uL   Basophils Relative 1 %   Basophils Absolute 0.1 0 - 0.1 K/uL  Urinalysis, Complete w Microscopic     Status: Abnormal   Collection Time: 07/27/16  5:20 AM  Result Value Ref Range   Color, Urine AMBER (A) YELLOW    Comment: BIOCHEMICALS MAY BE AFFECTED BY COLOR   APPearance TURBID (A) CLEAR   Specific Gravity, Urine 1.021 1.005 - 1.030   pH 6.0 5.0 - 8.0   Glucose, UA NEGATIVE NEGATIVE mg/dL   Hgb urine dipstick LARGE (A) NEGATIVE   Bilirubin Urine NEGATIVE NEGATIVE   Ketones, ur 5 (A) NEGATIVE mg/dL   Protein, ur 100 (A) NEGATIVE mg/dL   Nitrite NEGATIVE NEGATIVE   Leukocytes, UA LARGE (A) NEGATIVE   RBC / HPF TOO NUMEROUS TO COUNT 0 - 5 RBC/hpf   WBC, UA TOO NUMEROUS TO COUNT 0 - 5 WBC/hpf   Bacteria, UA MANY (A) NONE SEEN   Squamous Epithelial / LPF 6-30 (A) NONE SEEN   Mucous PRESENT   Urine culture     Status: Abnormal (Preliminary result)   Collection Time: 07/27/16  5:20 AM  Result Value Ref Range   Specimen Description URINE, RANDOM    Special Requests NONE    Culture >=100,000 COLONIES/mL ENTEROCOCCUS FAECIUM (A)    Report Status PENDING   Thyroid Panel With TSH     Status: Abnormal   Collection Time: 07/27/16  2:31 PM  Result  Value Ref Range   TSH 183.800 (H) 0.450 - 4.500 uIU/mL   T4, Total 1.6 (L) 4.5 - 12.0 ug/dL   T3 Uptake Ratio 19 (L) 24 - 39 %   Free Thyroxine Index 0.3 (L) 1.2 - 4.9    Comment: (NOTE) Performed At: [facility] [place], [place] [id number] Lindon Romp MD QB:[id number]   CBC     Status: Abnormal   Collection Time: 07/28/16  6:34 AM  Result Value Ref Range   WBC 4.6 3.6 - 11.0 K/uL   RBC 4.10 3.80 - 5.20 MIL/uL   Hemoglobin 12.0 12.0 - 16.0 g/dL   HCT 35.1 35.0 - 47.0 %   MCV 85.6 80.0 - 100.0 fL   MCH 29.3 26.0 - 34.0 pg   MCHC 34.3 32.0 - 36.0 g/dL   RDW 15.7 (H) 11.5 - 14.5 %   Platelets 186 150 - 440 K/uL  Basic metabolic panel     Status: Abnormal   Collection Time: 07/28/16  6:34 AM  Result Value Ref Range   Sodium 141 135 - 145 mmol/L   Potassium 4.2 3.5 - 5.1 mmol/L   Chloride 108 101 - 111 mmol/L   CO2 29 22 - 32 mmol/L   Glucose, Bld 104 (H) 65 - 99 mg/dL   BUN 20 6 - 20 mg/dL   Creatinine, Ser 0.99 0.44 - 1.00 mg/dL   Calcium 8.3 (L) 8.9 - 10.3 mg/dL   GFR calc non Af Amer 54 (L) >60 mL/min   GFR calc Af Amer >60 >60 mL/min    Comment: (NOTE) The eGFR has been calculated using the CKD EPI equation. This calculation has not been validated in all clinical situations. eGFR's persistently <60 mL/min signify possible Chronic Kidney Disease.    Anion  gap 4 (L) 5 - 15    Current Facility-Administered Medications  Medication Dose Route Frequency Provider Last Rate Last Dose  . acetaminophen (TYLENOL) tablet 650 mg  650 mg Oral Q6H PRN Dustin Flock, MD       Or  . acetaminophen (TYLENOL) suppository 650 mg  650 mg Rectal Q6H PRN Dustin Flock, MD      . ampicillin (OMNIPEN) 1 g in sodium chloride 0.9 % 50 mL IVPB  1 g Intravenous Q6H Loletha Grayer, MD      . enoxaparin (LOVENOX) injection 40 mg  40 mg Subcutaneous Q24H Dustin Flock, MD   40 mg at 07/27/16 2112  . famotidine (PEPCID) tablet 20 mg  20 mg Oral Daily Dustin Flock,  MD   20 mg at 07/28/16 0912  . haloperidol (HALDOL) tablet 0.5 mg  0.5 mg Oral TID Clovis Fredrickson, MD   0.5 mg at 07/28/16 0909  . hydrocortisone valerate cream (WESTCORT) 0.2 %   Topical BID Dustin Flock, MD      . Derrill Memo ON 07/29/2016] levothyroxine (SYNTHROID, LEVOTHROID) tablet 150 mcg  150 mcg Oral QAC breakfast Loletha Grayer, MD      . ondansetron [facility]) tablet 4 mg  4 mg Oral Q6H PRN Dustin Flock, MD       Or  . ondansetron (ZOFRAN) injection 4 mg  4 mg Intravenous Q6H PRN Dustin Flock, MD      . potassium chloride (K-DUR) CR tablet 10 mEq  10 mEq Oral Daily Dustin Flock, MD   10 mEq at 07/28/16 0912  . traMADol (ULTRAM) tablet 50 mg  50 mg Oral Q12H PRN Dustin Flock, MD        Musculoskeletal: Strength & Muscle Tone: within normal limits Gait & Station: normal Patient leans: N/A  Psychiatric Specialty Exam: Physical Exam  Nursing note and vitals reviewed. Constitutional: She appears well-developed and well-nourished.  HENT:  Head: Normocephalic and atraumatic.  Eyes: EOM are normal.  Neck: Normal range of motion.  Respiratory: Effort normal.  Musculoskeletal: Normal range of motion.  Neurological: She is alert.    Review of Systems  Genitourinary: Positive for urgency.  Psychiatric/Behavioral: Negative for depression.       Delusion and paranoia  All other systems reviewed and are negative.   Blood pressure 129/67, pulse (!) 55, temperature 97.4 F (36.3 C), temperature source Oral, resp. rate 16, height 5' 5"  (1.651 m), weight 65.8 kg (145 lb), SpO2 95 %.Body mass index is 24.13 kg/m.  General Appearance: Casual  Eye Contact:  Good  Speech:  Clear and Coherent  Volume:  Normal  Mood:  Dysphoric mild  Affect:  Appropriate  Thought Process:  Goal Directed and Descriptions of Associations: Intact  Orientation:  Other:  She was oriented to person, she was only partially oriented to place and situation. 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 She also appears concerned about issues with the water at her home. She seems to believe that the water is causing her to have a skin disease.  Agree with the recommendation of starting Haldol.  I will increase the dose from 0.5 mg 3 times a day to 0.5 mg 4 times a day.  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  Patient suffers from hypothyroidism and this is poorly controlled. This is also likely contributing to current confusion and paranoia.  I have concerns about her returning to leave at home on her own and care for herself. Collateral information will need to be obtained from family. I was unable to contact any family members.  If unable to contact family might need to involve adult protective services.   I discussed the case with the hospitalist. Social worker needs to be involved in this case.  Disposition: No evidence of imminent risk to self  or others at present.   Patient does not meet criteria for psychiatric inpatient admission. Supportive therapy provided about ongoing stressors. Discussed crisis plan, support from social network, calling 911, coming to the Emergency Department, and calling Suicide Hotline.  Hildred Priest, MD 07/28/2016 12:59 PM

## 2016-07-29 ENCOUNTER — Observation Stay: Payer: Medicare Other

## 2016-07-29 DIAGNOSIS — N3001 Acute cystitis with hematuria: Secondary | ICD-10-CM | POA: Diagnosis not present

## 2016-07-29 DIAGNOSIS — R410 Disorientation, unspecified: Secondary | ICD-10-CM | POA: Diagnosis not present

## 2016-07-29 LAB — URINE CULTURE

## 2016-07-29 LAB — RPR: RPR Ser Ql: NONREACTIVE

## 2016-07-29 MED ORDER — LINEZOLID 600 MG PO TABS
600.0000 mg | ORAL_TABLET | Freq: Two times a day (BID) | ORAL | Status: DC
  Administered 2016-07-29 – 2016-07-31 (×5): 600 mg via ORAL
  Filled 2016-07-29 (×6): qty 1

## 2016-07-29 MED ORDER — CYANOCOBALAMIN 1000 MCG/ML IJ SOLN
1000.0000 ug | Freq: Every morning | INTRAMUSCULAR | Status: DC
  Administered 2016-07-29 – 2016-07-31 (×3): 1000 ug via INTRAMUSCULAR
  Filled 2016-07-29 (×4): qty 1

## 2016-07-29 MED ORDER — CLOTRIMAZOLE 1 % VA CREA
1.0000 | TOPICAL_CREAM | Freq: Every day | VAGINAL | Status: DC
  Administered 2016-07-29 – 2016-07-30 (×3): 1 via VAGINAL
  Filled 2016-07-29: qty 45

## 2016-07-29 NOTE — Plan of Care (Signed)
Problem: Safety: Goal: Ability to remain free from injury will improve Outcome: Progressing Patient educated on the need to use caine when walking

## 2016-07-29 NOTE — Clinical Social Work Note (Signed)
Clinical Social Work Assessment  Patient Details  Name: Marie Mullen MRN: [id number] Date of Birth: [birth year]  Date of referral:  07/29/16               Reason for consult:  Abuse/Neglect, Facility Placement, Frequent Admissions / ED Visits, [facility] Resources, Guardianship Needs                Permission sought to share information with:   (Psychiatrist deemed lacking capacity.) Permission granted to share information::   (Psychiatrist deemed lacking capacity.)  Name::        Agency::     Relationship::     Contact Information:     Housing/Transportation Living arrangements for the past 2 months:  Single Family Home Source of Information:  Medical Team, Other (Comment Required) (Sister) Patient Interpreter Needed:  None Criminal Activity/Legal Involvement Pertinent to Current Situation/Hospitalization:  No - Comment as needed Significant Relationships:  Friend, Siblings Lives with:  Self Do you feel safe going back to the place where you live?  Yes (Psychiatrist deemed lacking capacity.) Need for family participation in patient care:  Yes (Comment)  Care giving concerns:  Competency/Guardianship concerns from Medical Team   Social Worker assessment / plan:  CSW received consult from the patient's attending concerning lack of capacity and need for placement. The CSW initiated an ALF referral with the permission of the patient's sister Marie Mullen as the patient's on-call psychiatrist deemed her incapable of decisions at this time (lacking capacity). Marie Mullen reported that the patient has Medicaid [facility]; however, the patient left the ALF due to not wanting to give up her SS check. Marie Mullen indicated that her sister's home is severely hoarded and that she fears her sister is not safe. Marie Mullen reported that she and her two other sisters have attempted over the years to assist the patient with finances and decisions; however, the patient "becomes paranoid and will not let us help her.  She has also said we cannot be HCPOA anymore. And we think that an outside guardian would be best at this point." Due to this behavior and the report of a hoarded home/unsafe living conditions, the CSW made a report to APS for self-neglect as well as possible need for competency hearing/guardianship.   Employment status:  Retired Health and safety inspectornsurance information:  Medicare PT Recommendations:  No Follow Up Information / Referral to community resources:  [facility] (Comment Required: [place], Name & Number of worker spoken with) ([facility] [place]  Patient/Family's Response to care:  The patient's family thanked the CSW for involvement.  Patient/Family's Understanding of and Emotional Response to Diagnosis, Current Treatment, and Prognosis:  The patient has been deemed lacking capacity. The patient's family understands the situation.  Emotional Assessment Appearance:   (Psychiatrist deemed lacking capacity.) Attitude/Demeanor/Rapport:   (Psychiatrist deemed lacking capacity.) Affect (typically observed):   (Psychiatrist deemed lacking capacity.) Orientation:   (Psychiatrist deemed lacking capacity.) Alcohol / Substance use:  Never Used Psych involvement (Current and /or in the community):  Yes (Comment) (Psychiatrist deemed lacking capacity.)  Discharge Needs  Concerns to be addressed:  Decision making concerns, Compliance Issues Concerns, Cognitive Concerns, Discharge Planning Concerns, Home Safety Concerns, Mental Health Concerns Readmission within the last 30 days:  No Current discharge risk:  Psychiatric Illness (Psychiatrist deemed lacking capacity.) Barriers to Discharge:  Continued Medical Work up, Unsafe home situation, Other (Awaiting APS assessment )   Judi CongKaren M Hadja Harral, LCSW 07/29/2016, 3:45 PM

## 2016-07-29 NOTE — Consult Note (Signed)
[facility] Consult   Reason for Consult: Hallucinations Referring Physician:  Dr. Posey Pronto Patient Identification: Marie Mullen MRN:  [id number] Principal Diagnosis: Acute delirium Diagnosis:   Patient Active Problem List   Diagnosis Date Noted  . Acute delirium [R41.0] 07/28/2016  . 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 Cerebrovascular disease [I67.9] 07/28/2016  . UTI (urinary tract infection) [N39.0] 07/27/2016  . Encephalopathy [G93.40] 09/15/2015  . Urinary tract infection [N39.0] 09/15/2015  . 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 Hypothyroidism [E03.9] 07/08/2015  . Bladder mass [N32.89] 07/08/2015    Total Time spent with patient: 45 minutes  Subjective:    Identifying data. Ms. Marie Mullen is a 75 y.o. female with no past psychiatric history.  Chief complaint. "I don't know what you are talking about."  12/08 The patient was admitted to medical floor for treatment of UTI. The chart on admission the patient was confused, driving around the parking lot, believing that there is a child somewhere, concern about poison in water, making phone calls to a plummer. She was agitated and uncooperative, tried to leave the hospital, and was placed on involuntary commitment. She has sitter now. The patient herself does not remember circumstances of her admission. She believes that she came to the hospital because of the numbness in her legs. She denies any unusual activities or distorted thinking. She denies any symptoms of depression, anxiety, or psychosis. She is not suicidal or homicidal. She does not use drugs or alcohol.  Initially, the patient was irritable and barely cooperative during my interview but warmed up and became rather talkative. She appears somewhat believing that people are trying to get into her house. She reportedly replaced the  locks in the house, garage, and door frame. She believes that this is her sister Marie Mullen trying to keep her in the facility and take her house over. She believes that He is jealous of her because she married Marie Mullen's first love.    12/09 I interviewed the patient today. She was at times tangential and required redirection during the interview. She continues to be paranoid thinking that her sister is stealing money from her. Says that her niece is a Conservation officer, historic buildings who was in federal prison. She talks about people plotting against her. She talks about her water being poisoned. Patient denies suicidality, homicidality or auditory or visual hallucinations. She also denies problems with depression or anxiety. She was oriented to person and to date. She knew she was in a hospital but wasn't sure of his name even though she has lived [place] all of her life. She was not oriented to the circumstances leading to her admission.  12/10 today the patient was alert and oriented in person place time and situation. She however continues to be very paranoid towards her sister. Tells me a very detailed story and how her sister fraudulently obtaining a healthcare power of attorney and is still a lot of money from her. Was able to contact the patient's sister, Marie Mullen. She feels that the patient has "no looseness living by herself". There are concerns about her ability to remember things and take her medications. She also has concerns about her driving. Per the emergency department note the patient was found driving in circles in the parking lot prior to coming into the hospital. Sister reports that normally she wouldn't be disoriented and loss.  Sr. says for example that she  uses the house and the car kids all the time. She says she bought her a special key change where the patient can track were her keys could be in the house. Sister says that 2 days later pt went to the police station reporting that somebody was tracking her.  The  older sister was pt's healthcare power of attorney up until recently. Pt was living in an ALF and her cognition improved significantly. The patient however continue to have a lot of delusions towards the older sister. Eventually the health care power of attorney was revoked per patient request and she moved out of the assisted living and went back home.  Past psychiatric history. She was never hospitalized, taken any psychotropic medications, or attempted suicide.  Family psychiatric history. She believes that there was a kin of hers living in [place] with mental illness.   Social history. 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 She had a son who died at the age of 71 on the Halloween night. She lives independently, drives, and takes care of herself without difficulties when well. Her other sister, works for judge Alford Highland a has been supportive.   Risk to Self: Is patient at risk for suicide?: No Risk to Others:  no Prior Inpatient Therapy:   Prior Outpatient Therapy:    Past Medical History:  Past Medical History:  Diagnosis Date  . Cardiac arrest ([place]) 1990   . Chronic kidney disease   . Complication of anesthesia    slow to wake up   . Eczema   . Headache   . Hypothyroidism   . Lower extremity edema   . Pneumonia    hx of   . Weakness of both legs    knees  . WPW (Wolff-Parkinson-White syndrome)     Past Surgical History:  Procedure Laterality Date  . ABDOMINAL HYSTERECTOMY    . BREAST ENHANCEMENT SURGERY Bilateral   . CATARACT EXTRACTION Bilateral   . CYSTOSCOPY W/ RETROGRADES Bilateral 12/28/2014   Procedure: CYSTOSCOPY WITH RETROGRADE PYELOGRAM;  Surgeon: Hollice Espy, MD;  Location: [facility] ORS;  Service: Urology;  Laterality: Bilateral;  . heart mapping     . TRANSURETHRAL RESECTION OF BLADDER TUMOR N/A 12/28/2014   Procedure: TRANSURETHRAL RESECTION OF BLADDER TUMOR (TURBT);  Surgeon: Hollice Espy, MD;  Location: [facility] ORS;   Service: Urology;  Laterality: N/A;  . wpw N/A    Family History:  Family History  Problem Relation Age of Onset  . Diabetes Neg Hx     Social History:  History  Alcohol Use No     History  Drug Use No    Social History   Social History  . Marital status: Widowed    Spouse name: N/A  . Number of children: N/A  . Years of education: N/A   Social History Main Topics  . Smoking status: Never Smoker  . Smokeless tobacco: Never Used  . Alcohol use No  . Drug use: No  . Sexual activity: Not Asked   Other Topics Concern  . None   Social History Narrative  . None   Additional Social History:    Allergies:  No Known Allergies  Labs:  Results for orders placed or performed during the hospital encounter of 07/27/16 (from the past 48 hour(s))  Thyroid Panel With TSH     Status: Abnormal   Collection Time: 07/27/16  2:31 PM  Result Value Ref Range   TSH 183.800 (H)  0.450 - 4.500 uIU/mL   T4, Total 1.6 (L) 4.5 - 12.0 ug/dL   T3 Uptake Ratio 19 (L) 24 - 39 %   Free Thyroxine Index 0.3 (L) 1.2 - 4.9    Comment: (NOTE) Performed At: [facility] [place], [place] [id number] Lindon Romp MD [id number]   CBC     Status: Abnormal   Collection Time: 07/28/16  6:34 AM  Result Value Ref Range   WBC 4.6 3.6 - 11.0 K/uL   RBC 4.10 3.80 - 5.20 MIL/uL   Hemoglobin 12.0 12.0 - 16.0 g/dL   HCT 35.1 35.0 - 47.0 %   MCV 85.6 80.0 - 100.0 fL   MCH 29.3 26.0 - 34.0 pg   MCHC 34.3 32.0 - 36.0 g/dL   RDW 15.7 (H) 11.5 - 14.5 %   Platelets 186 150 - 440 K/uL  Basic metabolic panel     Status: Abnormal   Collection Time: 07/28/16  6:34 AM  Result Value Ref Range   Sodium 141 135 - 145 mmol/L   Potassium 4.2 3.5 - 5.1 mmol/L   Chloride 108 101 - 111 mmol/L   CO2 29 22 - 32 mmol/L   Glucose, Bld 104 (H) 65 - 99 mg/dL   BUN 20 6 - 20 mg/dL   Creatinine, Ser 0.99 0.44 - 1.00 mg/dL   Calcium 8.3 (L) 8.9 - 10.3 mg/dL   GFR calc non Af Amer 54 (L) >60  mL/min   GFR calc Af Amer >60 >60 mL/min    Comment: (NOTE) The eGFR has been calculated using the CKD EPI equation. This calculation has not been validated in all clinical situations. eGFR's persistently <60 mL/min signify possible Chronic Kidney Disease.    Anion gap 4 (L) 5 - 15  Rapid HIV screen (HIV 1/2 Ab+Ag)     Status: None   Collection Time: 07/28/16 12:55 PM  Result Value Ref Range   HIV-1 P24 Antigen - HIV24 NON REACTIVE NON REACTIVE   HIV 1/2 Antibodies NON REACTIVE NON REACTIVE   Interpretation (HIV Ag Ab)      A non reactive test result means that HIV 1 or HIV 2 antibodies and HIV 1 p24 antigen were not detected in the specimen.  RPR     Status: None   Collection Time: 07/28/16 12:55 PM  Result Value Ref Range   RPR Ser Ql Non Reactive Non Reactive    Comment: (NOTE) Performed At: [facility] [place], [place] [id number] Lindon Romp MD [id number]   Vitamin B12     Status: None   Collection Time: 07/28/16 12:55 PM  Result Value Ref Range   Vitamin B-12 281 180 - 914 pg/mL    Comment: (NOTE) This assay is not validated for testing neonatal or myeloproliferative syndrome specimens for Vitamin B12 levels. Performed at [facility]     Current Facility-Administered Medications  Medication Dose Route Frequency Provider Last Rate Last Dose  . acetaminophen (TYLENOL) tablet 650 mg  650 mg Oral Q6H PRN Dustin Flock, MD       Or  . acetaminophen (TYLENOL) suppository 650 mg  650 mg Rectal Q6H PRN Dustin Flock, MD      . clotrimazole (GYNE-LOTRIMIN) vaginal cream 1 Applicatorful  1 Applicatorful Vaginal QHS Alexis Hugelmeyer, DO   1 Applicatorful at 16/07/37 0028  . cyanocobalamin ((VITAMIN B-12)) injection 1,000 mcg  1,000 mcg Intramuscular q morning - 10a Loletha Grayer, MD      .  enoxaparin (LOVENOX) injection 40 mg  40 mg Subcutaneous Q24H Dustin Flock, MD   40 mg at 07/28/16 2150  . famotidine (PEPCID) tablet 20 mg  20  mg Oral Daily Dustin Flock, MD   20 mg at 07/29/16 1054  . haloperidol (HALDOL) tablet 0.5 mg  0.5 mg Oral QID Hildred Priest, MD   0.5 mg at 07/29/16 1054  . hydrocortisone valerate cream (WESTCORT) 0.2 %   Topical BID Dustin Flock, MD      . levothyroxine (SYNTHROID, LEVOTHROID) tablet 125 mcg  125 mcg Oral QAC breakfast Loletha Grayer, MD   125 mcg at 07/29/16 0750  . linezolid (ZYVOX) tablet 600 mg  600 mg Oral Q12H Loletha Grayer, MD   600 mg at 07/29/16 1057  . ondansetron (ZOFRAN) tablet 4 mg  4 mg Oral Q6H PRN Dustin Flock, MD       Or  . ondansetron (ZOFRAN) injection 4 mg  4 mg Intravenous Q6H PRN Dustin Flock, MD      . potassium chloride (K-DUR) CR tablet 10 mEq  10 mEq Oral Daily Dustin Flock, MD   10 mEq at 07/29/16 1054    Musculoskeletal: Strength & Muscle Tone: within normal limits Gait & Station: normal Patient leans: N/A  Psychiatric Specialty Exam: Physical Exam  Nursing note and vitals reviewed. Constitutional: She appears well-developed and well-nourished.  HENT:  Head: Normocephalic and atraumatic.  Eyes: EOM are normal.  Neck: Normal range of motion.  Respiratory: Effort normal.  Musculoskeletal: Normal range of motion.  Neurological: She is alert.    Review of Systems  Genitourinary: Positive for urgency.  Psychiatric/Behavioral: Negative for depression.       Delusion and paranoia  All other systems reviewed and are negative.   Blood pressure (!) 156/80, Mullen 62, temperature 97.8 F (36.6 C), temperature source Oral, resp. rate 18, height 5' 5"  (1.651 m), weight 65.8 kg (145 lb), SpO2 96 %.Body mass index is 24.13 kg/m.  General Appearance: Casual  Eye Contact:  Good  Speech:  Clear and Coherent  Volume:  Normal  Mood:  Dysphoric mild  Affect:  Appropriate  Thought Process:  Goal Directed and Descriptions of Associations: Intact  Orientation:  Other:  She was oriented to person, she was only partially oriented to place and  situation. She knew the date, and the season of the year  Thought Content:  Delusions and Paranoid Ideation  Suicidal Thoughts:  No  Homicidal Thoughts:  No  Memory:  Immediate;   Fair Recent;   Fair Remote;   Fair  Judgement:  Impaired  Insight:  Shallow  Psychomotor Activity:  Normal  Concentration:  Concentration: Poor and Attention Span: Poor  Recall:  [employer] of Knowledge:  Fair  Language:  Fair  Akathisia:  No  Handed:  Right  AIMS (if indicated):     Assets:  Communication Skills Desire for Improvement Financial Resources/Insurance [place] Talents/Skills Transportation Vocational/Educational  ADL's:  Intact  Cognition:  WNL  Sleep:        Treatment Plan Summary: Daily contact with patient to assess and evaluate symptoms and progress in treatment and Medication management   Patient continues to have paranoid ideation and delusions mainly redirected to her sister, and her nice. She also appears concerned about issues with the water at her home. She seems to believe that the water is causing her to have a skin disease.  Continue Haldol 0.5 mg 4 times a day.  I  requested a brain MRI, B12, RPR and HIV to rule out organic causes for dementia.  B12 is within the normal limits, RPR and HIV are nonreactive. 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  Patient suffers from hypothyroidism and this is poorly controlled. This is also likely contributing to current confusion and paranoia.  I have concerns about her returning to leave at home on her own and care for herself. Collateral information has been obtained from the patient's sister who also has concerns about her ability to care for herself without supervision. They feel that the patient will be better care for at a assisted living facility but they believe she will never be in agreement with  that.  I do not feel that the patient has social capacity. She does not have any insight into her cognitive deficits. This clearly that she has not been compliant with medications as evidenced by poorly controlled hypothyroidism. I also have concerns about her ability is driving.   -Patient should be reported to the [facility] medical department in [place]. Patient shouldl not drive  her vehicle without clearance from the DMV.  -Appears that there are significant concerns about the patient's safety and ability to care for herself without supervision. If patient is discharged back to her home alone consider making a referral to Adult Protective Services. Adult Protective Services can investigate the case in the community.  -Family says that they will be willing to help anyway possible in trying to get the patient placed into an assisted living facility.  -Involuntary commitment will be terminated as patient is not meeting criteria for inpatient psychiatric admission  Disposition: No evidence of imminent risk to self or others at present.   Patient does not meet criteria for psychiatric inpatient admission. Supportive therapy provided about ongoing stressors. Discussed crisis plan, support from social network, calling 911, coming to the Emergency Department, and calling Suicide Hotline.  Hildred Priest, MD 07/29/2016 12:45 PM

## 2016-07-29 NOTE — Care Management Obs Status (Signed)
MEDICARE OBSERVATION STATUS NOTIFICATION   Patient Details  Name: Marie Mullen MRN: [id number] Date of Birth: [birth year]   Medicare Observation Status Notification Given:  Yes    Caren MacadamMichelle Norma Montemurro, RN 07/29/2016, 4:45 PM

## 2016-07-29 NOTE — Progress Notes (Signed)
Patient ID: Marie Mullen, female   DOB: 06-21-1941, 75 y.o.   MRN: [id number]  [employer] PROGRESS NOTE  Marie Mullen [id number] DOB: 06-21-1941 DOA: 07/27/2016 PCP: No PCP Per Patient  HPI/Subjective: Patient feeling fine physically. Offers no complaints.   Objective: Vitals:   07/29/16 0602 07/29/16 1206  BP: 131/84 (!) 156/80  Pulse: 83 62  Resp:  18  Temp:  97.8 F (36.6 C)    Filed Weights   07/27/16 0326  Weight: 65.8 kg (145 lb)    ROS: Review of Systems  Constitutional: Negative for chills and fever.  Eyes: Negative for blurred vision.  Respiratory: Negative for cough and shortness of breath.   Cardiovascular: Negative for chest pain.  Gastrointestinal: Negative for abdominal pain, constipation, diarrhea, nausea and vomiting.  Genitourinary: Negative for dysuria.  Musculoskeletal: Negative for joint pain.  Skin: Positive for rash.  Neurological: Negative for dizziness and headaches.   Exam: Physical Exam  Constitutional: She is oriented to person, place, and time.  HENT:  Nose: No mucosal edema.  Mouth/Throat: No oropharyngeal exudate or posterior oropharyngeal edema.  Eyes: Conjunctivae, EOM and lids are normal. Pupils are equal, round, and reactive to light.  Neck: No JVD present. Carotid bruit is not present. No edema present. No thyroid mass and no thyromegaly present.  Cardiovascular: S1 normal and S2 normal.  Exam reveals no gallop.   No murmur heard. Pulses:      Dorsalis pedis pulses are 2+ on the right side, and 2+ on the left side.  Respiratory: No respiratory distress. She has no wheezes. She has no rhonchi. She has no rales.  GI: Soft. Bowel sounds are normal. There is no tenderness.  Musculoskeletal:       Right ankle: She exhibits no swelling.       Left ankle: She exhibits no swelling.  Lymphadenopathy:    She has no cervical adenopathy.  Neurological: She is alert and oriented to person, place, and time. No cranial nerve  deficit.  Skin: Skin is warm. Nails show no clubbing.  Patient skin has healing lesions. Could be secondary to scratching. No signs of infection  Psychiatric: She has a normal mood and affect. Thought content is paranoid.      Data Reviewed: Basic Metabolic Panel:  Recent Labs Lab 07/25/16 1249 07/27/16 0440 07/28/16 0634  NA 139 142 141  K 3.4* 3.7 4.2  CL 103 105 108  CO2 28 30 29   GLUCOSE 169* 112* 104*  BUN 19 24* 20  CREATININE 1.08* 1.23* 0.99  CALCIUM 9.1 8.9 8.3*  CBC:  Recent Labs Lab 07/25/16 1249 07/27/16 0440 07/28/16 0634  WBC 7.3 5.6 4.6  NEUTROABS  --  3.7  --   HGB 13.7 12.5 12.0  HCT 41.0 36.9 35.1  MCV 85.8 86.5 85.6  PLT 215 207 186     Recent Results (from the past 240 hour(s))  Urine culture     Status: Abnormal   Collection Time: 07/27/16  5:20 AM  Result Value Ref Range Status   Specimen Description URINE, RANDOM  Final   Special Requests NONE  Final   Culture (A)  Final    >=100,000 COLONIES/mL VANCOMYCIN RESISTANT ENTEROCOCCUS   Report Status 07/29/2016 FINAL  Final   Organism ID, Bacteria VANCOMYCIN RESISTANT ENTEROCOCCUS (A)  Final      Susceptibility   Vancomycin resistant enterococcus - MIC*    AMPICILLIN >=32 RESISTANT Resistant     LEVOFLOXACIN >=8 RESISTANT Resistant  NITROFURANTOIN 128 RESISTANT Resistant     VANCOMYCIN >=32 RESISTANT Resistant     LINEZOLID 2 SENSITIVE Sensitive     * >=100,000 COLONIES/mL VANCOMYCIN RESISTANT ENTEROCOCCUS      Scheduled Meds: . clotrimazole  1 Applicatorful Vaginal QHS  . cyanocobalamin  1,000 mcg Intramuscular q morning - 10a  . enoxaparin (LOVENOX) injection  40 mg Subcutaneous Q24H  . famotidine  20 mg Oral Daily  . haloperidol  0.5 mg Oral QID  . hydrocortisone valerate cream   Topical BID  . levothyroxine  125 mcg Oral QAC breakfast  . linezolid  600 mg Oral Q12H  . potassium chloride  10 mEq Oral Daily    Assessment/Plan:  1. Acute delirium with paranoid thinking.  Case Discussed with psychiatry. IVC was discontinued. Psychiatry does not feel that she is safe to go home. Case discussed with Child psychotherapistsocial worker and they will put out feelers to group homes in assisted living. Adult Protective Services referral also made. Case discussed with sister and she does not want to be the healthcare power of attorney would like them to proceed with guardianship. 2. Severe hypothyroidism. Noncompliance with medications.   Restarted levothyroxine. 3. VR E UTI. Switch antibiotics to Zyvox. 4. Skin rash. Antibiotic should help. 5. Send heavy metal profile 6. PT evaluation 7. Chronic kidney disease stage III   Code Status:     Code Status Orders        Start     Ordered   07/27/16 1053  Full code  Continuous     07/27/16 1052    Code Status History    Date Active Date Inactive Code Status Order ID Comments User Context   09/15/2015  9:07 PM 09/20/2015 12:16 AM Full Code [id number]  Wyatt Hasteavid K Hower, MD ED   07/06/2015  5:32 PM 07/09/2015  6:20 PM Full Code [id number]  Ramonita LabAruna Gouru, MD Inpatient     Disposition Plan: We'll talk again tomorrow with social worker about plan  Consultants:  psychiatry  Antibiotics:  Zyvox  Time spent: 30 minutes, case discussed with psychiatry and sister on the phone  Alford HighlandWIETING, Marie Augspurger  [employer]

## 2016-07-30 DIAGNOSIS — N3001 Acute cystitis with hematuria: Secondary | ICD-10-CM | POA: Diagnosis not present

## 2016-07-30 LAB — BASIC METABOLIC PANEL
ANION GAP: 6 (ref 5–15)
BUN: 19 mg/dL (ref 6–20)
CALCIUM: 8.8 mg/dL — AB (ref 8.9–10.3)
CHLORIDE: 104 mmol/L (ref 101–111)
CO2: 29 mmol/L (ref 22–32)
CREATININE: 1.11 mg/dL — AB (ref 0.44–1.00)
GFR calc non Af Amer: 47 mL/min — ABNORMAL LOW (ref >60)
GFR, EST AFRICAN AMERICAN: 54 mL/min — AB (ref >60)
Glucose, Bld: 114 mg/dL — ABNORMAL HIGH (ref 65–99)
Potassium: 3.8 mmol/L (ref 3.5–5.1)
Sodium: 139 mmol/L (ref 135–145)

## 2016-07-30 MED ORDER — TUBERCULIN PPD 5 UNIT/0.1ML ID SOLN
5.0000 [IU] | Freq: Once | INTRADERMAL | Status: DC
  Administered 2016-07-30: 5 [IU] via INTRADERMAL
  Filled 2016-07-30: qty 0.1

## 2016-07-30 NOTE — Clinical Social Work Note (Signed)
CSW spoke with Marie Mullen at [facility] this morning as [facility] had [facility]. Marie Mullen stated that patient's medicaid will only pay for special care assistance and that they have a bed in one of their facilities. Marie Mullen has agreed to come see patient today and is aware the intent would be to discharge her today if a safe plan is arranged. Marie Mullen stated that patient has always been paranoid about her sister and that this is not likely to ever change.   CSW spoke with patient this morning. As patient is continuously paranoid about her sister being out to take her money and out to get her, CSW did not bring up patient's sister. CSW  allowed patient to vocalize her concerns regarding the fact that her sister has poisoned her water and thus causing a constant rash on her arms. Patient also vocalized how she believes her sister has made her sign at least 9 erroneous POA's and that she is going to call the police on her. Patient blames her sister for keeping her in [facility] last time. CSW attempted to alleviate patient's fears somewhat by discussing that DSS APS was contacted yesterday and they are going to investigate the conditions of her home and going to investigate her sister and the allegations that patient has made. Patient seemed somewhat relieved by this. When CSW informed patient that Marie Mullen with [facility] was going to come by and speak with her today she stated that someone from [facility] who knew she was in the hospital came to see her yesterday and told her that they had a bed for her. CSW explained that placement right now would be the best thing for her in order to give DSS APS time to investigate the allegations she has made toward her sister and because psychiatry has evaluated her and felt this to be the best scenario as well. CSW will await for Marie Mullen to come speak with patient.  Marie SpanielMonica Haliey Mullen MSW,LCSW [phone]

## 2016-07-30 NOTE — Progress Notes (Signed)
Patient ID: Marie Rossettiolly G Bjorkman, female   DOB: October 30, 1940, 75 y.o.   MRN: [id number]  [facility] PROGRESS NOTE  Marie Mullen BJY:[id number] DOB: October 30, 1940 DOA: 07/27/2016 PCP: No PCP Per Patient  HPI/Subjective: Patient feeling fine physically. Offers no complaints. Patient agreeable to assisted living facility.  Objective: Vitals:   07/30/16 0849 07/30/16 1345  BP: (!) 154/76 133/67  Pulse: 65 71  Resp: 16 18  Temp: 98.1 F (36.7 C) 97.4 F (36.3 C)    Filed Weights   07/27/16 0326  Weight: 65.8 kg (145 lb)    ROS: Review of Systems  Constitutional: Negative for chills and fever.  Eyes: Negative for blurred vision.  Respiratory: Negative for cough and shortness of breath.   Cardiovascular: Negative for chest pain.  Gastrointestinal: Negative for abdominal pain, constipation, diarrhea, nausea and vomiting.  Genitourinary: Negative for dysuria.  Musculoskeletal: Negative for joint pain.  Skin: Positive for rash.  Neurological: Negative for dizziness and headaches.   Exam: Physical Exam  Constitutional: She is oriented to person, place, and time.  HENT:  Nose: No mucosal edema.  Mouth/Throat: No oropharyngeal exudate or posterior oropharyngeal edema.  Eyes: Conjunctivae, EOM and lids are normal. Pupils are equal, round, and reactive to light.  Neck: No JVD present. Carotid bruit is not present. No edema present. No thyroid mass and no thyromegaly present.  Cardiovascular: S1 normal and S2 normal.  Exam reveals no gallop.   No murmur heard. Pulses:      Dorsalis pedis pulses are 2+ on the right side, and 2+ on the left side.  Respiratory: No respiratory distress. She has no wheezes. She has no rhonchi. She has no rales.  GI: Soft. Bowel sounds are normal. There is no tenderness.  Musculoskeletal:       Right ankle: She exhibits no swelling.       Left ankle: She exhibits no swelling.  Lymphadenopathy:    She has no cervical adenopathy.  Neurological: She is alert  and oriented to person, place, and time. No cranial nerve deficit.  Skin: Skin is warm. Nails show no clubbing.  Patient skin has healing lesions. Could be secondary to scratching. No signs of infection  Psychiatric: She has a normal mood and affect. Thought content is paranoid.      Data Reviewed: Basic Metabolic Panel:  Recent Labs Lab 07/25/16 1249 07/27/16 0440 07/28/16 0634 07/30/16 0459  NA 139 142 141 139  K 3.4* 3.7 4.2 3.8  CL 103 105 108 104  CO2 28 30 29 29   GLUCOSE 169* 112* 104* 114*  BUN 19 24* 20 19  CREATININE 1.08* 1.23* 0.99 1.11*  CALCIUM 9.1 8.9 8.3* 8.8*  CBC:  Recent Labs Lab 07/25/16 1249 07/27/16 0440 07/28/16 0634  WBC 7.3 5.6 4.6  NEUTROABS  --  3.7  --   HGB 13.7 12.5 12.0  HCT 41.0 36.9 35.1  MCV 85.8 86.5 85.6  PLT 215 207 186     Recent Results (from the past 240 hour(s))  Urine culture     Status: Abnormal   Collection Time: 07/27/16  5:20 AM  Result Value Ref Range Status   Specimen Description URINE, RANDOM  Final   Special Requests NONE  Final   Culture (A)  Final    >=100,000 COLONIES/mL VANCOMYCIN RESISTANT ENTEROCOCCUS   Report Status 07/29/2016 FINAL  Final   Organism ID, Bacteria VANCOMYCIN RESISTANT ENTEROCOCCUS (A)  Final      Susceptibility   Vancomycin resistant enterococcus -  MIC*    AMPICILLIN >=32 RESISTANT Resistant     LEVOFLOXACIN >=8 RESISTANT Resistant     NITROFURANTOIN 128 RESISTANT Resistant     VANCOMYCIN >=32 RESISTANT Resistant     LINEZOLID 2 SENSITIVE Sensitive     * >=100,000 COLONIES/mL VANCOMYCIN RESISTANT ENTEROCOCCUS      Scheduled Meds: . clotrimazole  1 Applicatorful Vaginal QHS  . cyanocobalamin  1,000 mcg Intramuscular q morning - 10a  . enoxaparin (LOVENOX) injection  40 mg Subcutaneous Q24H  . famotidine  20 mg Oral Daily  . haloperidol  0.5 mg Oral QID  . hydrocortisone valerate cream   Topical BID  . levothyroxine  125 mcg Oral QAC breakfast  . linezolid  600 mg Oral Q12H  .  potassium chloride  10 mEq Oral Daily  . tuberculin  5 Units Intradermal Once    Assessment/Plan:  1. Acute delirium with paranoid thinking. Patient right now agreeable to assisted living. Assisted living can take the patient tomorrow.  2. Severe hypothyroidism. Noncompliance with medications.   Restarted levothyroxine. 3. VRE UTI. On po Zyvox. 4. Skin rash. Antibiotic should help. 5. Pending heavy metal profile 6. PT evaluation 7. Chronic kidney disease stage III 8. PPD ordered and can be read over the facility. Chest x-ray read by radiologist as negative.   Code Status:     Code Status Orders        Start     Ordered   07/27/16 1053  Full code  Continuous     07/27/16 1052    Code Status History    Date Active Date Inactive Code Status Order ID Comments User Context   09/15/2015  9:07 PM 09/20/2015 12:16 AM Full Code [id number]  Wyatt Hasteavid K Hower, MD ED   07/06/2015  5:32 PM 07/09/2015  6:20 PM Full Code [id number]  Ramonita LabAruna Gouru, MD Inpatient     Disposition Plan:  To assisted living tomorrow  Consultants:  psychiatry  Antibiotics:  Zyvox  Time spent: 25 minutes  Alford HighlandWIETING, Anuhea Gassner  [employer]

## 2016-07-30 NOTE — Clinical Social Work Note (Signed)
CSW spoke with patient who stated that she would be willing to go to [facility]. [facility] today for most of the day today but then when CSW called her at 1:45 to tell her patient was willing to come to her, she informed CSW that she would have to get her tomorrow. MD has been informed. Patient will require a PPD to be placed at hospital and can be read at [facility] when time. York SpanielMonica Lashawne Dura MSW,LCSW [phone]

## 2016-07-30 NOTE — Clinical Social Work Note (Signed)
Cleotis NipperBob Cauthren with [facility] [place] came to visit with patient as they have an open investigation regarding patient. Mr. Baird CancerCauthren has known patient for some time now and is aware of her fragile living situation and paranoia involving her sister. Jance with [facility] has also arrived to see patient and is currently speaking with patient now. York SpanielMonica Maryalice Pasley MSW
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
## 2016-07-31 DIAGNOSIS — N3001 Acute cystitis with hematuria: Secondary | ICD-10-CM | POA: Diagnosis not present

## 2016-07-31 LAB — CBC
HEMATOCRIT: 38 % (ref 35.0–47.0)
HEMOGLOBIN: 12.9 g/dL (ref 12.0–16.0)
MCH: 29.3 pg (ref 26.0–34.0)
MCHC: 33.9 g/dL (ref 32.0–36.0)
MCV: 86.5 fL (ref 80.0–100.0)
Platelets: 226 10*3/uL (ref 150–440)
RBC: 4.39 MIL/uL (ref 3.80–5.20)
RDW: 16.1 % — AB (ref 11.5–14.5)
WBC: 5.2 10*3/uL (ref 3.6–11.0)

## 2016-07-31 LAB — HEAVY METALS, BLOOD
Arsenic: 8 ug/L (ref 2–23)
LEAD: NOT DETECTED ug/dL (ref 0–19)
MERCURY: NOT DETECTED ug/L (ref 0.0–14.9)

## 2016-07-31 MED ORDER — VITAMIN B-12 1000 MCG PO TABS: 500.0000 ug | ORAL_TABLET | Freq: Every day | ORAL | Status: DC

## 2016-07-31 MED ORDER — HYDROCORTISONE VALERATE 0.2 % EX OINT: TOPICAL_OINTMENT | CUTANEOUS | 1 refills | Status: AC

## 2016-07-31 MED ORDER — VITAMIN B-12 1000 MCG PO TABS
500.0000 ug | ORAL_TABLET | Freq: Every day | ORAL | Status: DC
Start: 2016-07-31 — End: 2016-07-31

## 2016-07-31 MED ORDER — HALOPERIDOL 0.5 MG PO TABS: 0.5000 mg | ORAL_TABLET | Freq: Four times a day (QID) | ORAL | 0 refills | Status: AC

## 2016-07-31 MED ORDER — CYANOCOBALAMIN 500 MCG PO TABS: 500.0000 ug | ORAL_TABLET | Freq: Every day | ORAL | 0 refills | Status: AC

## 2016-07-31 MED ORDER — POTASSIUM CHLORIDE ER 10 MEQ PO TBCR: 10.0000 meq | EXTENDED_RELEASE_TABLET | Freq: Every day | ORAL | 0 refills | Status: AC

## 2016-07-31 MED ORDER — LEVOTHYROXINE SODIUM 125 MCG PO TABS: 125.0000 ug | ORAL_TABLET | Freq: Every day | ORAL | 0 refills | Status: AC

## 2016-07-31 MED ORDER — LINEZOLID 600 MG PO TABS: 600.0000 mg | ORAL_TABLET | Freq: Two times a day (BID) | ORAL | 0 refills | Status: DC

## 2016-07-31 NOTE — Clinical Social Work Placement (Signed)
   CLINICAL SOCIAL WORK PLACEMENT  NOTE  Date:  07/31/2016  Patient Details  Name: Marie Mullen MRN: [id number] Date of Birth: 19-Jul-1941  Clinical Social Work is seeking post-discharge placement for this patient at the Assisted Living Facility level of care (*CSW will initial, date and re-position this form in  chart as items are completed):  Yes   Patient/family provided with [facility] Clinical Social Work Department's list of facilities offering this level of care within the geographic area requested by the patient (or if unable, by the patient's family).  Yes   Patient/family informed of their freedom to choose among providers that offer the needed level of care, that participate in Medicare, Medicaid or managed care program needed by the patient, have an available bed and are willing to accept the patient.   (N/A)   Patient/family informed of [facility]'s ownership interest in [facility] and [facility], as well as of the fact that they are under no obligation to receive care at these facilities.  PASRR submitted to EDS on       PASRR number received on       Existing PASRR number confirmed on 07/30/16     FL2 transmitted to all facilities in geographic area requested by pt/family on       FL2 transmitted to all facilities within larger geographic area on       Patient informed that his/her managed care company has contracts with or will negotiate with certain facilities, including the following:        Yes   Patient/family informed of bed offers received.  Patient chooses bed at  [facility])     Physician recommends and patient chooses bed at  (ALF)    Patient to be transferred to  ([facility]) on 07/31/16.  Patient to be transferred to facility by  Idelia Salm([facility])     Patient family notified on 07/31/16 of transfer.  Name of family member notified:   (DSS APS/Sister)     PHYSICIAN       Additional Comment:     _______________________________________________ York SpanielMonica Carreen Milius, LCSW 07/31/2016, 10:42 AM
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
## 2016-07-31 NOTE — [place] FL2 (Signed)
[place] MEDICAID FL2 LEVEL OF CARE SCREENING TOOL     IDENTIFICATION  Patient Name: Marie Mullen Birthdate: 1941-08-20 Sex: female Admission Date (Current Location): 07/27/2016  [facility] [place]   Facility and Address:  [facility], [address], [place], [place] [address]      Provider Number: [id number]  Attending Physician Name and Address:  Alford Highlandichard Wieting, MD  Relative Name and Phone Number:       Current Level of Care: Hospital Recommended Level of Care: Assisted Living Facility Prior Approval Number:    Date Approved/Denied:   PASRR Number: [id number]  Discharge Plan:  (ALF)    Current Diagnoses: Dementia Recurrent uti Paranoia and delusional thinking no psychiatric history Cardiac arrest (HCC) 1990    . Chronic kidney disease   . Complication of anesthesia    slow to wake up   . Eczema   . Headache   . Hypothyroidism   . Lower extremity edema   . Pneumonia    hx of   . Weakness of both legs    knees  . WPW (Wolff-Parkinson-White syndrome)      Orientation RESPIRATION BLADDER Height & Weight     Self, Time, Situation, Place  Normal Continent Weight: 145 lb (65.8 kg) Height:  5\' 5"  (165.1 cm)  BEHAVIORAL SYMPTOMS/MOOD NEUROLOGICAL BOWEL NUTRITION STATUS   (none)  (none) Continent Diet (regular)  AMBULATORY STATUS COMMUNICATION OF NEEDS Skin   Supervision Verbally Normal (skin rash)                       Personal Care Assistance Level of Assistance  Bathing, Dressing Bathing Assistance: Limited assistance   Dressing Assistance: Limited assistance     Functional Limitations Info  Sight Sight Info: Impaired        SPECIAL CARE FACTORS FREQUENCY  PT (By licensed PT) (home health)                    Contractures Contractures Info: Not present    Additional Factors Info  Code Status, Allergies Code Status Info: full Allergies Info: nka           Current  Discharge Medication List        START taking these medications   Details  haloperidol (HALDOL) 0.5 MG tablet Take 1 tablet (0.5 mg total) by mouth 4 (four) times daily. Qty: 120 tablet, Refills: 0    linezolid (ZYVOX) 600 MG tablet Take 1 tablet (600 mg total) by mouth every 12 (twelve) hours. Qty: 10 tablet, Refills: 0    vitamin B-12 500 MCG tablet Take 1 tablet (500 mcg total) by mouth daily. Qty: 30 tablet, Refills: 0          CONTINUE these medications which have CHANGED   Details  hydrocortisone valerate ointment (WESTCORT) 0.2 % Apply to affected area daily Qty: 45 g, Refills: 1    levothyroxine (SYNTHROID, LEVOTHROID) 125 MCG tablet Take 1 tablet (125 mcg total) by mouth daily before breakfast. Qty: 30 tablet, Refills: 0    potassium chloride (K-DUR) 10 MEQ tablet Take 1 tablet (10 mEq total) by mouth daily. Qty: 30 tablet, Refills: 0         STOP taking these medications     famotidine (PEPCID) 20 MG tablet      hydrOXYzine (ATARAX) 10 MG/5ML syrup      traMADol (ULTRAM) 50 MG tablet  Relevant Imaging Results:  Relevant Lab Results:   Additional Information    Shela Leff, LCSW

## 2016-07-31 NOTE — Clinical Social Work Note (Signed)
Patient to discharge today to [facility]. [facility] her [facility]. York SpanielMonica Joan Herschberger MSW,LCSW [phone]

## 2016-07-31 NOTE — Discharge Planning (Signed)
Patient IV x2 removed.  Caregiver here to transport home to [facility].  Gave quick report plus packet and scripts.  RN assessment and VS reveal stability for DC to facility. CG wants to give patient quick shower, then we'll wheel patient to front to be transported.

## 2016-08-01 NOTE — Clinical Social Work Note (Signed)
Liborio NixonJanice with [facility] contacted CSW at 1:30 today to say that she could not get patient's z[facility]. CSW asked RN CM to check on this as patient left to go to [facility] yesterday about noon and there was no call from [facility] stating that there was any concern about obtaining her medication. RN CM called [employer] and was given the prior approval phone number for the insurance company. RN CM called in the prior approval and patient's zyvox is covered at 100% with no copay. CSW has [facility] and CSW has called [facility] and let Juanita know as well. York SpanielMonica Arie Gable MSW,LCSW [phone]

## 2016-09-12 ENCOUNTER — Encounter: Payer: Self-pay | Admitting: *Deleted

## 2016-09-12 ENCOUNTER — Emergency Department
Admission: EM | Admit: 2016-09-12 | Discharge: 2016-09-12 | Disposition: A | Discharge status: Home / Self Care | Payer: Medicare Other | Attending: Emergency Medicine | Admitting: Emergency Medicine

## 2016-09-12 DIAGNOSIS — E039 Hypothyroidism, unspecified: Secondary | ICD-10-CM | POA: Insufficient documentation

## 2016-09-12 DIAGNOSIS — Z79899 Other long term (current) drug therapy: Secondary | ICD-10-CM | POA: Diagnosis not present

## 2016-09-12 DIAGNOSIS — N3000 Acute cystitis without hematuria: Secondary | ICD-10-CM

## 2016-09-12 DIAGNOSIS — R21 Rash and other nonspecific skin eruption: Secondary | ICD-10-CM

## 2016-09-12 DIAGNOSIS — N182 Chronic kidney disease, stage 2 (mild): Secondary | ICD-10-CM | POA: Insufficient documentation

## 2016-09-12 LAB — URINALYSIS, COMPLETE (UACMP) WITH MICROSCOPIC
Bilirubin Urine: NEGATIVE
Glucose, UA: NEGATIVE mg/dL
Ketones, ur: NEGATIVE mg/dL
NITRITE: NEGATIVE
PH: 5 (ref 5.0–8.0)
Protein, ur: 100 mg/dL — AB
SPECIFIC GRAVITY, URINE: 1.011 (ref 1.005–1.030)

## 2016-09-12 MED ORDER — CEPHALEXIN 500 MG PO CAPS: 500.0000 mg | ORAL_CAPSULE | Freq: Once | ORAL | Status: DC

## 2016-09-12 MED ORDER — DIPHENHYDRAMINE HCL 25 MG PO CAPS: 25.0000 mg | ORAL_CAPSULE | Freq: Three times a day (TID) | ORAL | 0 refills | Status: AC | PRN

## 2016-09-12 MED ORDER — DIPHENHYDRAMINE HCL 25 MG PO CAPS
25.0000 mg | ORAL_CAPSULE | Freq: Once | ORAL | Status: AC
  Administered 2016-09-12: 25 mg via ORAL
  Filled 2016-09-12: qty 1

## 2016-09-12 MED ORDER — CEPHALEXIN 500 MG PO CAPS: 500.0000 mg | ORAL_CAPSULE | Freq: Four times a day (QID) | ORAL | 0 refills | Status: AC

## 2016-09-12 NOTE — ED Provider Notes (Addendum)
[facility] Emergency Department Provider Note  ____________________________________________  Time seen: Approximately 7:18 PM  I have reviewed the triage vital signs and the nursing notes.   HISTORY  Chief Complaint Rash    HPI Marie Mullen is a 76 y.o. female brought from her nursing home with a history of neurocognitive disorder, encephalopathy, recurrent UTI, currently being treated with Cipro for the past 5 days presenting for an itchy rash on the arms and upper chest. The patient has been scratching at the rash, which she states started yesterday.  No other new medications, foods, detergents, that she knows of although hx may be limited.   Past Medical History:  Diagnosis Date  . Cardiac arrest (HCC) 1990   . Chronic kidney disease   . Complication of anesthesia    slow to wake up   . Eczema   . Headache   . Hypothyroidism   . Lower extremity edema   . Pneumonia    hx of   . Weakness of both legs    knees  . WPW (Wolff-Parkinson-White syndrome)     Patient Active Problem List   Diagnosis Date Noted  . Acute delirium 07/28/2016  . 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 Cerebrovascular disease 07/28/2016  . UTI (urinary tract infection) 07/27/2016  . Encephalopathy 09/15/2015  . Urinary tract infection 09/15/2015  . CKD (chronic kidney disease) stage 2, GFR 60-89 ml/min 07/08/2015  . Hypothyroidism 07/08/2015  . Bladder mass 07/08/2015    Past Surgical History:  Procedure Laterality Date  . ABDOMINAL HYSTERECTOMY    . BREAST ENHANCEMENT SURGERY Bilateral   . CATARACT EXTRACTION Bilateral   . CYSTOSCOPY W/ RETROGRADES Bilateral 12/28/2014   Procedure: CYSTOSCOPY WITH RETROGRADE PYELOGRAM;  Surgeon: Vanna Scotland, MD;  Location: [facility] ORS;  Service: Urology;  Laterality: Bilateral;  . heart mapping     . TRANSURETHRAL RESECTION OF BLADDER TUMOR N/A 12/28/2014    Procedure: TRANSURETHRAL RESECTION OF BLADDER TUMOR (TURBT);  Surgeon: Vanna Scotland, MD;  Location: [facility] ORS;  Service: Urology;  Laterality: N/A;  . wpw N/A     Current Outpatient Rx  . Order #: [id number] Class: Print  . Order #: [id number] Class: Print  . Order #: [id number] Class: Print  . Order #: [id number] Class: Print  . Order #: [id number] Class: Print  . Order #: [id number] Class: Print  . Order #: [id number] Class: Print  . Order #: [id number] Class: Print    Allergies Ciprofloxacin  Family History  Problem Relation Age of Onset  . Diabetes Neg Hx     Social History Social History  Substance Use Topics  . Smoking status: Never Smoker  . Smokeless tobacco: Never Used  . Alcohol use No    Review of Systems Constitutional: No fever/chills.No lightheadedness or syncope. Eyes: No visual changes. ENT: No sore throat. No congestion or rhinorrhea. No involvement of the mouth, pain or rash in the buccal mucosa. No swelling of the mouth, shortness of breath, stridor. No drooling or difficulty swallowing. Cardiovascular: Denies chest pain. Denies palpitations. Respiratory: Denies shortness of breath.  No cough. Gastrointestinal: No abdominal pain.  No nausea, no vomiting.  No diarrhea.  No constipation. Genitourinary: Negative for dysuria. Musculoskeletal: Negative for back pain. Skin: Negative for rash. Neurological: Negative for headaches. No focal numbness, tingling or weakness.   10-point ROS otherwise negative.  ____________________________________________   PHYSICAL EXAM:  VITAL SIGNS: ED Triage Vitals [09/12/16 1829]  Enc Vitals Group     BP  137/80     Pulse Rate 75     Resp 20     Temp 98.6 F (37 C)     Temp Source Oral     SpO2 98 %     Weight 150 lb (68 kg)     Height 5\' 6"  (1.676 m)     Head Circumference      Peak Flow      Pain Score 5     Pain Loc      Pain Edu?      Excl. in GC?     Constitutional: Patient is alert and demented. She is able to  answer questions appropriately. She is chronically ill appearing but not in any acute distress. Eyes: Conjunctivae are normal.  EOMI. No scleral icterus. No eye discharge. Head: Atraumatic. Nose: No congestion/rhinnorhea. Mouth/Throat: Mucous membranes are moist. No swelling of the lips or tongue, no fullness submandibular. No posterior pharyngeal erythema, tonsillar swelling or exudate, uvula is normal size and midline. No drooling. No trismus. Neck: No stridor.  Supple.  No meningismus. Cardiovascular: Normal rate, regular rhythm. No murmurs, rubs or gallops.  Respiratory: Normal respiratory effort.  No accessory muscle use or retractions. Lungs CTAB.  No wheezes, rales or ronchi. Gastrointestinal: Soft, nontender and nondistended.  No guarding or rebound.  No peritoneal signs. Musculoskeletal: No LE edema.  Neurologic:  A&Ox3.  Speech is clear.  Face and smile are symmetric.  EOMI.  Moves all extremities well. Skin:  The patient has multiple lesions diffusely on the bilateral forearms, and upper right chest that are circular, minimally raised, some of which are scabbed over. She does not have any abnormal findings in the webs of the fingers.  No involvement of the mucous membranes or abnormalities in the buccal mucosa. Psychiatric: Mood and affect are normal with me, the patient does speak of writing a letter to the president with the nurse.  ____________________________________________   LABS (all labs ordered are listed, but only abnormal results are displayed)  Labs Reviewed  URINALYSIS, COMPLETE (UACMP) WITH MICROSCOPIC - Abnormal; Notable for the following:       Result Value   Color, Urine YELLOW (*)    APPearance HAZY (*)    Hgb urine dipstick SMALL (*)    Protein, ur 100 (*)    Leukocytes, UA MODERATE (*)    Bacteria, UA RARE (*)    Squamous Epithelial / LPF 6-30 (*)    All other components within normal limits  URINE CULTURE    ____________________________________________  EKG  Not indicated ____________________________________________  RADIOLOGY  No results found.  ____________________________________________   PROCEDURES  Procedure(s) performed: None  Procedures  Critical Care performed: No ____________________________________________   INITIAL IMPRESSION / ASSESSMENT AND PLAN / ED COURSE  Pertinent labs & imaging results that were available during my care of the patient were reviewed by me and considered in my medical decision making (see chart for details).  76 y.o. F on Ciprofloxacin presumably for UTI presenting w/ urticarial rash.  There is no evidence of airway compromise. The patient has already had Cipro for 4 half days of the medication, and she is asymptomatic from a urinary symptoms standpoint, so we will check her for UTI to see if we need to transition her to another medication. I will treat her with Benadryl, and reevaluate her after we have the urinalysis results back. It is possible that her rash comes from another source although I do not see any evidence of scabies. I  would consider bedbugs but expect lesions more diffusely. I do not see any evidence of dermatologic emergency including Stevens-Johnson syndrome.  ----------------------------------------- 9:05 PM on 09/12/2016 -----------------------------------------  The patient does have a significantly improved urinalysis compared to previous, but she does continue to have some bacteria and leukocyte esterase. I have looked at her sensitivities, and she has had some highly resistant organisms, as well as UTIs that do show sensitivity to multiple antibiotics. I will switch her to Keflex and have her stop taking ciprofloxacin in case this is what her rash is coming from. I have sent her urine for culture, and we'll have her follow-up with her primary care physician.  ____________________________________________  FINAL CLINICAL  IMPRESSION(S) / ED DIAGNOSES  Final diagnoses:  Rash  Acute cystitis without hematuria         NEW MEDICATIONS STARTED DURING THIS VISIT:  New Prescriptions   CEPHALEXIN (KEFLEX) 500 MG CAPSULE    Take 1 capsule (500 mg total) by mouth 4 (four) times daily.   DIPHENHYDRAMINE (BENADRYL) 25 MG CAPSULE    Take 1 capsule (25 mg total) by mouth every 8 (eight) hours as needed for itching.      Rockne MenghiniAnne-Caroline Aevah Stansbery, MD 09/12/16 1925    Rockne MenghiniAnne-Caroline Yeilin Zweber, MD 09/12/16 2105

## 2016-09-12 NOTE — ED Notes (Signed)
Dr. Sharma CovertNorman at bedside  Pt told this RN that she needed to write a letter back to the president because he sent her a letter. Pt asked to use the restroom, this RN went and got pt a walker and will assist her to bathroom.

## 2016-09-12 NOTE — ED Triage Notes (Signed)
Pt to ED via EMS from [facility], pt c/o rash to upper extremities xfew days, was recently on antibiotics for UTI. Pt VS stable. Pt A&Ox4, NAD noted.

## 2016-09-12 NOTE — ED Notes (Signed)
Pt sleeping, blanket covering her. Lights dimmed for comfort.

## 2016-09-12 NOTE — Discharge Instructions (Signed)
The cause of your rash is not completely clear, although it is possible that you developed the rash due to Ciprofloxacin.  Please stop that medication.  You may take Benadryl for rash and itchiness. Please stop scratching your rash, as this can lead to a skin infection.  Please take the entire course of antibiotics, even if you're feeling better. Stop taking Ciprofloxacin.  Return to the emergency department if you develop swelling of the lips, tongue, or mouth, drooling, difficulty swallowing or shortness of breath, lightheadedness or fainting, low blood pressures were high heart rate, fever, or any other symptoms concerning to you.

## 2016-09-12 NOTE — ED Notes (Signed)
Answered pt. Call from room.  Pt. Needed help to bathroom.  Pt. Helped to bathroom.  Pt. Requested warm blankets, pt. Given two warm blankets.

## 2016-09-12 NOTE — ED Notes (Signed)
Pt states about a day and a half she has an allergic reaction on R arm. Pt has numerous red raised bite looking type bumps. Pt thinks there may be on on L arm. States itchy. R arm is red around bumps as well. Thinks she may have had something given to her to help with rash this AM at [facility] but unsure. No new detergents, fabrics, no new foods.

## 2016-09-15 LAB — URINE CULTURE

## 2016-09-19 ENCOUNTER — Encounter: Payer: Self-pay | Admitting: Emergency Medicine

## 2016-09-19 ENCOUNTER — Emergency Department
Admission: EM | Admit: 2016-09-19 | Discharge: 2016-09-19 | Disposition: A | Discharge status: Home / Self Care | Payer: Medicare Other | Attending: Emergency Medicine | Admitting: Emergency Medicine

## 2016-09-19 ENCOUNTER — Emergency Department: Payer: Medicare Other

## 2016-09-19 DIAGNOSIS — R109 Unspecified abdominal pain: Secondary | ICD-10-CM

## 2016-09-19 DIAGNOSIS — E309 Disorder of puberty, unspecified: Secondary | ICD-10-CM | POA: Diagnosis not present

## 2016-09-19 DIAGNOSIS — N182 Chronic kidney disease, stage 2 (mild): Secondary | ICD-10-CM | POA: Insufficient documentation

## 2016-09-19 DIAGNOSIS — N3001 Acute cystitis with hematuria: Secondary | ICD-10-CM | POA: Diagnosis not present

## 2016-09-19 DIAGNOSIS — Z79899 Other long term (current) drug therapy: Secondary | ICD-10-CM | POA: Diagnosis not present

## 2016-09-19 DIAGNOSIS — R1011 Right upper quadrant pain: Secondary | ICD-10-CM | POA: Diagnosis present

## 2016-09-19 LAB — COMPREHENSIVE METABOLIC PANEL
ALBUMIN: 3.5 g/dL (ref 3.5–5.0)
ALT: 7 U/L — ABNORMAL LOW (ref 14–54)
ANION GAP: 6 (ref 5–15)
AST: 18 U/L (ref 15–41)
Alkaline Phosphatase: 100 U/L (ref 38–126)
BILIRUBIN TOTAL: 0.6 mg/dL (ref 0.3–1.2)
BUN: 16 mg/dL (ref 6–20)
CO2: 27 mmol/L (ref 22–32)
Calcium: 8.8 mg/dL — ABNORMAL LOW (ref 8.9–10.3)
Chloride: 107 mmol/L (ref 101–111)
Creatinine, Ser: 0.78 mg/dL (ref 0.44–1.00)
GFR calc Af Amer: >60 mL/min (ref >60)
GFR calc non Af Amer: >60 mL/min (ref >60)
Glucose, Bld: 106 mg/dL — ABNORMAL HIGH (ref 65–99)
POTASSIUM: 4.1 mmol/L (ref 3.5–5.1)
SODIUM: 140 mmol/L (ref 135–145)
TOTAL PROTEIN: 6.4 g/dL — AB (ref 6.5–8.1)

## 2016-09-19 LAB — CBC
HEMATOCRIT: 36.7 % (ref 35.0–47.0)
HEMOGLOBIN: 12.6 g/dL (ref 12.0–16.0)
MCH: 29.8 pg (ref 26.0–34.0)
MCHC: 34.4 g/dL (ref 32.0–36.0)
MCV: 86.6 fL (ref 80.0–100.0)
Platelets: 222 10*3/uL (ref 150–440)
RBC: 4.24 MIL/uL (ref 3.80–5.20)
RDW: 14.7 % — ABNORMAL HIGH (ref 11.5–14.5)
WBC: 6.2 10*3/uL (ref 3.6–11.0)

## 2016-09-19 LAB — URINALYSIS, COMPLETE (UACMP) WITH MICROSCOPIC
BILIRUBIN URINE: NEGATIVE
Glucose, UA: NEGATIVE mg/dL
KETONES UR: 5 mg/dL — AB
NITRITE: NEGATIVE
PROTEIN: 100 mg/dL — AB
Specific Gravity, Urine: 1.018 (ref 1.005–1.030)
pH: 5 (ref 5.0–8.0)

## 2016-09-19 LAB — LACTIC ACID, PLASMA: LACTIC ACID, VENOUS: 0.8 mmol/L (ref 0.5–1.9)

## 2016-09-19 LAB — LIPASE, BLOOD: Lipase: 19 U/L (ref 11–51)

## 2016-09-19 MED ORDER — DEXTROSE 5 % IV SOLN: 1.0000 g | Freq: Once | INTRAVENOUS | Status: DC

## 2016-09-19 MED ORDER — CEFTRIAXONE SODIUM-DEXTROSE 1-3.74 GM-% IV SOLR
1.0000 g | Freq: Once | INTRAVENOUS | Status: AC
  Administered 2016-09-19: 1 g via INTRAVENOUS
  Filled 2016-09-19: qty 50

## 2016-09-19 MED ORDER — IOPAMIDOL (ISOVUE-300) INJECTION 61%
30.0000 mL | Freq: Once | INTRAVENOUS | Status: AC | PRN
  Administered 2016-09-19: 30 mL via ORAL

## 2016-09-19 MED ORDER — IOPAMIDOL (ISOVUE-300) INJECTION 61%
100.0000 mL | Freq: Once | INTRAVENOUS | Status: AC | PRN
  Administered 2016-09-19: 100 mL via INTRAVENOUS

## 2016-09-19 MED ORDER — LINEZOLID 600 MG PO TABS: 600.0000 mg | ORAL_TABLET | Freq: Two times a day (BID) | ORAL | 0 refills | Status: AC

## 2016-09-19 NOTE — ED Provider Notes (Signed)
[facility] Emergency Department Provider Note   ____________________________________________   First MD Initiated Contact with Patient 09/19/16 [phone]     (approximate)  I have reviewed the triage vital signs and the nursing notes.   HISTORY  Chief Complaint Abdominal Pain    HPI Marie Mullen is a 76 y.o. female who comes into the hospital today with some right-sided abdominal pain. The pain started 30-45 minutes prior to her arrival. She reports that she lives in assisted living facility and they told her to come in. The patient denies any vomiting or diarrhea. She denies any nausea, dizziness, lightheadedness. She rates pain a 3 out of 10 in intensity and is currently going away. She has never had this before. She said it was sharp pain in her right upper quadrant. The patient has also not had any fevers. He is here for evaluation.   Past Medical History:  Diagnosis Date  . Cardiac arrest (HCC) 1990   . Chronic kidney disease   . Complication of anesthesia    slow to wake up   . Eczema   . Headache   . Hypothyroidism   . Lower extremity edema   . Pneumonia    hx of   . Weakness of both legs    knees  . WPW (Wolff-Parkinson-White syndrome)     Patient Active Problem List   Diagnosis Date Noted  . Acute delirium 07/28/2016  . 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 Cerebrovascular disease 07/28/2016  . UTI (urinary tract infection) 07/27/2016  . Encephalopathy 09/15/2015  . Urinary tract infection 09/15/2015  . CKD (chronic kidney disease) stage 2, GFR 60-89 ml/min 07/08/2015  . Hypothyroidism 07/08/2015  . Bladder mass 07/08/2015    Past Surgical History:  Procedure Laterality Date  . ABDOMINAL HYSTERECTOMY    . BREAST ENHANCEMENT SURGERY Bilateral   . CATARACT EXTRACTION Bilateral   . CYSTOSCOPY W/ RETROGRADES Bilateral 12/28/2014   Procedure: CYSTOSCOPY WITH  RETROGRADE PYELOGRAM;  Surgeon: Vanna ScotlandAshley Brandon, MD;  Location: [facility] ORS;  Service: Urology;  Laterality: Bilateral;  . heart mapping     . TRANSURETHRAL RESECTION OF BLADDER TUMOR N/A 12/28/2014   Procedure: TRANSURETHRAL RESECTION OF BLADDER TUMOR (TURBT);  Surgeon: Vanna ScotlandAshley Brandon, MD;  Location: [facility] ORS;  Service: Urology;  Laterality: N/A;  . wpw N/A     Prior to Admission medications   Medication Sig Start Date End Date Taking? Authorizing Provider  diphenhydrAMINE (BENADRYL) 25 mg capsule Take 1 capsule (25 mg total) by mouth every 8 (eight) hours as needed for itching. 09/12/16 09/12/17 Yes Anne-Caroline Sharma CovertNorman, MD  haloperidol (HALDOL) 0.5 MG tablet Take 1 tablet (0.5 mg total) by mouth 4 (four) times daily. 07/31/16  Yes Richard Renae GlossWieting, MD  hydrocortisone valerate ointment (WESTCORT) 0.2 % Apply to affected area daily 07/31/16 07/31/17 Yes Alford Highlandichard Wieting, MD  levothyroxine (SYNTHROID, LEVOTHROID) 125 MCG tablet Take 1 tablet (125 mcg total) by mouth daily before breakfast. 08/01/16  Yes Alford Highlandichard Wieting, MD  Multiple Vitamins-Minerals (PRESERVISION AREDS 2+MULTI VIT PO) Take 1 capsule by mouth 2 (two) times daily.   Yes Historical Provider, MD  potassium chloride (K-DUR) 10 MEQ tablet Take 1 tablet (10 mEq total) by mouth daily. 07/31/16  Yes Alford Highlandichard Wieting, MD  vitamin B-12 500 MCG tablet Take 1 tablet (500 mcg total) by mouth daily. 08/01/16  Yes Richard Renae GlossWieting, MD  cephALEXin (KEFLEX) 500 MG capsule Take 1 capsule (500 mg total) by  mouth 4 (four) times daily. Patient not taking: Reported on 09/19/2016 09/12/16 09/22/16  Rockne Menghini, MD  linezolid (ZYVOX) 600 MG tablet Take 1 tablet (600 mg total) by mouth 2 (two) times daily. 09/19/16 09/29/16  Rebecka Apley, MD    Allergies Ciprofloxacin  Family History  Problem Relation Age of Onset  . Diabetes Neg Hx     Social History Social History  Substance Use Topics  . Smoking status: Never Smoker  . Smokeless tobacco:  Never Used  . Alcohol use No    Review of Systems Constitutional: No fever/chills Eyes: No visual changes. ENT: No sore throat. Cardiovascular: Denies chest pain. Respiratory: Denies shortness of breath. Gastrointestinal:  abdominal pain.  No nausea, no vomiting.  No diarrhea.  No constipation. Genitourinary: Negative for dysuria. Musculoskeletal: Negative for back pain. Skin: Negative for rash. Neurological: Negative for headaches, focal weakness or numbness.  10-point ROS otherwise negative.  ____________________________________________   PHYSICAL EXAM:  VITAL SIGNS: ED Triage Vitals  Enc Vitals Group     BP 09/19/16 0221 (!) 155/84     Pulse Rate 09/19/16 0220 76     Resp 09/19/16 0222 18     Temp 09/19/16 0222 97.6 F (36.4 C)     Temp Source 09/19/16 0222 Oral     SpO2 09/19/16 0300 97 %     Weight 09/19/16 0220 150 lb (68 kg)     Height 09/19/16 0220 5\' 6"  (1.676 m)     Head Circumference --      Peak Flow --      Pain Score 09/19/16 0220 5     Pain Loc --      Pain Edu? --      Excl. in GC? --     Constitutional: Alert and oriented. Well appearing and in Mild distress. Eyes: Conjunctivae are normal. PERRL. EOMI. Head: Atraumatic. Nose: No congestion/rhinnorhea. Mouth/Throat: Mucous membranes are moist.  Oropharynx non-erythematous. Cardiovascular: Normal rate, regular rhythm. Grossly normal heart sounds.  Good peripheral circulation. Respiratory: Normal respiratory effort.  No retractions. Lungs CTAB. Gastrointestinal: Soft With some mild right-sided tenderness to palpation. No distention.  Musculoskeletal: No lower extremity tenderness nor edema.  Positive bowel sounds Neurologic:  Normal speech and language.  Skin:  Skin is warm, dry and intact.  Psychiatric: Mood and affect are normal.   ____________________________________________   LABS (all labs ordered are listed, but only abnormal results are displayed)  Labs Reviewed  COMPREHENSIVE  METABOLIC PANEL - Abnormal; Notable for the following:       Result Value   Glucose, Bld 106 (*)    Calcium 8.8 (*)    Total Protein 6.4 (*)    ALT 7 (*)    All other components within normal limits  CBC - Abnormal; Notable for the following:    RDW 14.7 (*)    All other components within normal limits  URINALYSIS, COMPLETE (UACMP) WITH MICROSCOPIC - Abnormal; Notable for the following:    Color, Urine YELLOW (*)    APPearance CLOUDY (*)    Hgb urine dipstick LARGE (*)    Ketones, ur 5 (*)    Protein, ur 100 (*)    Leukocytes, UA SMALL (*)    Bacteria, UA MANY (*)    Squamous Epithelial / LPF 6-30 (*)    All other components within normal limits  URINE CULTURE  LIPASE, BLOOD  LACTIC ACID, PLASMA   ____________________________________________  EKG  ED ECG REPORT I, Rebecka Apley, the  attending physician, personally viewed and interpreted this ECG.   Date: 09/19/2016  EKG Time: 237  Rate: 77  Rhythm: normal sinus rhythm  Axis: normal  Intervals:none  ST&T Change: none  ____________________________________________  RADIOLOGY  CT abdomen and pelvis ____________________________________________   PROCEDURES  Procedure(s) performed: None  Procedures  Critical Care performed: No  ____________________________________________   INITIAL IMPRESSION / ASSESSMENT AND PLAN / ED COURSE  Pertinent labs & imaging results that were available during my care of the patient were reviewed by me and considered in my medical decision making (see chart for details).  This is a 76 year old female who comes into the hospital today with some abdominal pain. It started right before she came in. The patient has no fevers, no nausea no vomiting no back pain or other complaints. We did check some blood work and it shows that the patient has acute cystitis. The patient did receive a dose of ceftriaxone. I also sent the patient for a CT scan given her age. She does have some  irregular bladder wall thickening with some ill-defined low density calcification with a concern for possible neoplasm. The patient's urine though also shows too numerous to count white cells and red cells. I did give the patient dose of ceftriaxone. While looking back at the patient's previous results it was found that 2 of the last 3 urine cultures were positive for vancomycin-resistant enterococcus in the patient's urine. It is only sensitive to linezolid. I will write a prescription for the patient as we do not have that medication available orally in the emergency department. Patient will be discharged back to her nursing facility.  Clinical Course as of Sep 19 738  Wed Sep 19, 2016  0618 1. Abnormal eccentric irregular bladder wall thickening about the dome and right lateral aspect with perivesicular edema. Ill-defined low density with peripheral calcification in the region of irregular wall thickening. 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 Recommend cystoscopy. Wall calcification versus dependent debris in the right dependent bladder. 2. Bilateral renal parenchymal scarring. Bilateral renal cysts, parapelvic on the left and exophytic on the right. Bilateral renal calcifications, parenchymal calcifications versus nonobstructing stones. 3. Abdominal aortic atherosclerosis. No aneurysm. 4. Small hiatal hernia. Enteric contrast in the distal esophagus can be seen with reflux or slow transit.   CT Abdomen Pelvis W Contrast [AW]    Clinical Course User Index [AW] Rebecka Apley, MD     ____________________________________________   FINAL CLINICAL IMPRESSION(S) / ED DIAGNOSES  Final diagnoses:  Abdominal pain, unspecified abdominal location  Acute cystitis with hematuria      NEW MEDICATIONS STARTED DURING THIS VISIT:  New Prescriptions   LINEZOLID (ZYVOX) 600 MG TABLET    Take 1 tablet (600 mg total) by mouth 2 (two) times daily.     Note:  This document was  prepared using Dragon voice recognition software and may include unintentional dictation errors.    Rebecka Apley, MD 09/19/16 [phone]

## 2016-09-19 NOTE — ED Triage Notes (Signed)
Patient comes in from [facility] via [facility] for right lower quadrant pain that started about a hour or so ago. EMS reports that patient was treated for a UTI last week. Patient denies any N/V/D. Patient rates pain 5/10.

## 2016-09-19 NOTE — ED Notes (Signed)
Pt and Pt's caregiver verbalized understanding of discharge instructions. NAD at this time. 

## 2016-09-19 NOTE — Care Management Note (Signed)
Case Management Note  Patient Details  Name: Marie Mullen MRN: [id number] Date of Birth: 02-05-1941  Subjective/Objective:        [facility] to confirm she has escalated the request for INS auth for zyvox as prescribed for the patient.   I have providede her with the dx as written by Dr Zenda AlpersWebster. [facility]. The process will take up to 72 hours.         Action/Plan:   Expected Discharge Date:                  Expected Discharge Plan:     In-House Referral:     Discharge planning Services     Post Acute Care Choice:    Choice offered to:     DME Arranged:    DME Agency:     HH Arranged:    HH Agency:     Status of Service:     If discussed at [employer] Length of Stay Meetings, dates discussed:    Additional Comments:  Berna BueCheryl Darreld Hoffer, RN 09/19/2016, 1:08 PM

## 2016-09-21 LAB — URINE CULTURE: Culture: >=100000 — AB

## 2016-10-18 DEATH — deceased

## 2016-11-02 ENCOUNTER — Inpatient Hospital Stay
Admission: EM | Admit: 2016-11-02 | Discharge: 2016-11-07 | DRG: 690 | Disposition: A | Discharge status: Home / Self Care | Payer: Medicare Other | Attending: Internal Medicine | Admitting: Internal Medicine

## 2016-11-02 ENCOUNTER — Encounter: Payer: Self-pay | Admitting: *Deleted

## 2016-11-02 DIAGNOSIS — L309 Dermatitis, unspecified: Secondary | ICD-10-CM | POA: Diagnosis present

## 2016-11-02 DIAGNOSIS — Z8674 Personal history of sudden cardiac arrest: Secondary | ICD-10-CM | POA: Diagnosis not present

## 2016-11-02 DIAGNOSIS — Z90710 Acquired absence of both cervix and uterus: Secondary | ICD-10-CM

## 2016-11-02 DIAGNOSIS — Z87440 Personal history of urinary (tract) infections: Secondary | ICD-10-CM

## 2016-11-02 DIAGNOSIS — Z833 Family history of diabetes mellitus: Secondary | ICD-10-CM | POA: Diagnosis not present

## 2016-11-02 DIAGNOSIS — B961 Klebsiella pneumoniae [K. pneumoniae] as the cause of diseases classified elsewhere: Secondary | ICD-10-CM | POA: Diagnosis present

## 2016-11-02 DIAGNOSIS — Z8249 Family history of ischemic heart disease and other diseases of the circulatory system: Secondary | ICD-10-CM

## 2016-11-02 DIAGNOSIS — F0390 Unspecified dementia without behavioral disturbance: Secondary | ICD-10-CM | POA: Diagnosis present

## 2016-11-02 DIAGNOSIS — N182 Chronic kidney disease, stage 2 (mild): Secondary | ICD-10-CM | POA: Diagnosis present

## 2016-11-02 DIAGNOSIS — I129 Hypertensive chronic kidney disease with stage 1 through stage 4 chronic kidney disease, or unspecified chronic kidney disease: Secondary | ICD-10-CM | POA: Diagnosis present

## 2016-11-02 DIAGNOSIS — E039 Hypothyroidism, unspecified: Secondary | ICD-10-CM | POA: Diagnosis present

## 2016-11-02 DIAGNOSIS — Z881 Allergy status to other antibiotic agents status: Secondary | ICD-10-CM

## 2016-11-02 DIAGNOSIS — I456 Pre-excitation syndrome: Secondary | ICD-10-CM | POA: Diagnosis present

## 2016-11-02 DIAGNOSIS — N390 Urinary tract infection, site not specified: Principal | ICD-10-CM | POA: Diagnosis present

## 2016-11-02 DIAGNOSIS — I679 Cerebrovascular disease, unspecified: Secondary | ICD-10-CM | POA: Diagnosis present

## 2016-11-02 DIAGNOSIS — Z789 Other specified health status: Secondary | ICD-10-CM

## 2016-11-02 LAB — BASIC METABOLIC PANEL
Anion gap: 9 (ref 5–15)
BUN: 27 mg/dL — ABNORMAL HIGH (ref 6–20)
CALCIUM: 9.1 mg/dL (ref 8.9–10.3)
CHLORIDE: 102 mmol/L (ref 101–111)
CO2: 26 mmol/L (ref 22–32)
CREATININE: 1.05 mg/dL — AB (ref 0.44–1.00)
GFR calc non Af Amer: 50 mL/min — ABNORMAL LOW (ref >60)
GFR, EST AFRICAN AMERICAN: 58 mL/min — AB (ref >60)
GLUCOSE: 124 mg/dL — AB (ref 65–99)
Potassium: 4.8 mmol/L (ref 3.5–5.1)
Sodium: 137 mmol/L (ref 135–145)

## 2016-11-02 LAB — URINALYSIS, COMPLETE (UACMP) WITH MICROSCOPIC
Bilirubin Urine: NEGATIVE
Glucose, UA: NEGATIVE mg/dL
Ketones, ur: 20 mg/dL — AB
Nitrite: POSITIVE — AB
PROTEIN: 100 mg/dL — AB
Specific Gravity, Urine: 1.021 (ref 1.005–1.030)
pH: 6 (ref 5.0–8.0)

## 2016-11-02 LAB — LACTIC ACID, PLASMA: LACTIC ACID, VENOUS: 1.2 mmol/L (ref 0.5–1.9)

## 2016-11-02 LAB — CBC
HCT: 38.5 % (ref 35.0–47.0)
HEMOGLOBIN: 12.8 g/dL (ref 12.0–16.0)
MCH: 28.7 pg (ref 26.0–34.0)
MCHC: 33.3 g/dL (ref 32.0–36.0)
MCV: 86.1 fL (ref 80.0–100.0)
Platelets: 359 10*3/uL (ref 150–440)
RBC: 4.47 MIL/uL (ref 3.80–5.20)
RDW: 13.8 % (ref 11.5–14.5)
WBC: 18.5 10*3/uL — ABNORMAL HIGH (ref 3.6–11.0)

## 2016-11-02 MED ORDER — PHENAZOPYRIDINE HCL 100 MG PO TABS
100.0000 mg | ORAL_TABLET | Freq: Three times a day (TID) | ORAL | Status: DC
  Administered 2016-11-03 – 2016-11-05 (×7): 100 mg via ORAL
  Filled 2016-11-02 (×8): qty 1

## 2016-11-02 MED ORDER — ONDANSETRON HCL 4 MG/2ML IJ SOLN: 4.0000 mg | Freq: Four times a day (QID) | INTRAMUSCULAR | Status: DC | PRN

## 2016-11-02 MED ORDER — ONDANSETRON HCL 4 MG PO TABS: 4.0000 mg | ORAL_TABLET | Freq: Four times a day (QID) | ORAL | Status: DC | PRN

## 2016-11-02 MED ORDER — DIPHENHYDRAMINE HCL 25 MG PO CAPS
25.0000 mg | ORAL_CAPSULE | Freq: Three times a day (TID) | ORAL | Status: DC | PRN
  Administered 2016-11-02: 20:00:00 25 mg via ORAL
  Filled 2016-11-02: qty 1

## 2016-11-02 MED ORDER — CEFEPIME-DEXTROSE 1 GM/50ML IV SOLR
1.0000 g | Freq: Once | INTRAVENOUS | Status: AC
  Administered 2016-11-02: 1 g via INTRAVENOUS
  Filled 2016-11-02: qty 50

## 2016-11-02 MED ORDER — POTASSIUM CHLORIDE CRYS ER 10 MEQ PO TBCR
10.0000 meq | EXTENDED_RELEASE_TABLET | Freq: Every day | ORAL | Status: DC
  Administered 2016-11-03 – 2016-11-07 (×5): 10 meq via ORAL
  Filled 2016-11-02 (×8): qty 1

## 2016-11-02 MED ORDER — LINEZOLID 600 MG PO TABS
600.0000 mg | ORAL_TABLET | Freq: Once | ORAL | Status: AC
  Administered 2016-11-02: 600 mg via ORAL
  Filled 2016-11-02: qty 1

## 2016-11-02 MED ORDER — OCUVITE-LUTEIN PO CAPS
1.0000 | ORAL_CAPSULE | Freq: Two times a day (BID) | ORAL | Status: DC
  Administered 2016-11-02 – 2016-11-07 (×10): 1 via ORAL
  Filled 2016-11-02 (×10): qty 1

## 2016-11-02 MED ORDER — VITAMIN B-12 100 MCG PO TABS
500.0000 ug | ORAL_TABLET | Freq: Every day | ORAL | Status: DC
  Administered 2016-11-03 – 2016-11-07 (×5): 500 ug via ORAL
  Filled 2016-11-02 (×5): qty 5

## 2016-11-02 MED ORDER — SODIUM CHLORIDE 0.9 % IV BOLUS (SEPSIS)
1000.0000 mL | Freq: Once | INTRAVENOUS | Status: AC
  Administered 2016-11-02: 1000 mL via INTRAVENOUS

## 2016-11-02 MED ORDER — ACETAMINOPHEN 325 MG PO TABS
650.0000 mg | ORAL_TABLET | Freq: Four times a day (QID) | ORAL | Status: DC | PRN
  Administered 2016-11-02 – 2016-11-03 (×2): 650 mg via ORAL
  Filled 2016-11-02 (×2): qty 2

## 2016-11-02 MED ORDER — CEFEPIME-DEXTROSE 1 GM/50ML IV SOLR
1.0000 g | INTRAVENOUS | Status: DC
  Administered 2016-11-03 – 2016-11-05 (×3): 1 g via INTRAVENOUS
  Filled 2016-11-02 (×4): qty 50

## 2016-11-02 MED ORDER — LINEZOLID 600 MG PO TABS
600.0000 mg | ORAL_TABLET | Freq: Two times a day (BID) | ORAL | Status: DC
  Administered 2016-11-02 – 2016-11-06 (×8): 600 mg via ORAL
  Filled 2016-11-02 (×8): qty 1

## 2016-11-02 MED ORDER — LEVOTHYROXINE SODIUM 125 MCG PO TABS
125.0000 ug | ORAL_TABLET | Freq: Every day | ORAL | Status: DC
  Administered 2016-11-03 – 2016-11-07 (×5): 125 ug via ORAL
  Filled 2016-11-02 (×5): qty 1

## 2016-11-02 MED ORDER — ENOXAPARIN SODIUM 40 MG/0.4ML ~~LOC~~ SOLN
40.0000 mg | SUBCUTANEOUS | Status: DC
  Administered 2016-11-02 – 2016-11-06 (×5): 40 mg via SUBCUTANEOUS
  Filled 2016-11-02 (×5): qty 0.4

## 2016-11-02 MED ORDER — ACETAMINOPHEN 650 MG RE SUPP: 650.0000 mg | Freq: Four times a day (QID) | RECTAL | Status: DC | PRN

## 2016-11-02 NOTE — ED Triage Notes (Signed)
Pt arrives via EMS from [facility], pt was diagnosed a UTI 2 days ago, given abx but states continued "bladder pain", per pt "everything hurts, when I swallow it goes to my bladder and hurts", upon arrival pt awake and alert

## 2016-11-02 NOTE — H&P (Signed)
[employer] [facility] [place] [facility]   PATIENT NAME: Marie Mullen    MR#:  [id number]  DATE OF BIRTH:  1941/03/27  DATE OF ADMISSION:  11/02/2016  PRIMARY CARE PHYSICIAN: No PCP Per Patient   REQUESTING/REFERRING PHYSICIAN: Dr. Sharyn Creamer  CHIEF COMPLAINT:   Chief Complaint  Patient presents with  . Dysuria    HISTORY OF PRESENT ILLNESS:  Marie Mullen  is a 76 y.o. female with a known history of Hypertension, chronic kidney disease, hypothyroidism, history of WPW, history of previous VRE UTI, who presents to the hospital due to ongoing dysuria. Patient says she's been having worsening dysuria for the past few days progressively getting worse. She was recently diagnosed with a VRE UTI a few months back and treated with Zyvox. She was started on oral Zyvox to 3 days back but despite taking the antibiotics she continues to have worsening dysuria. She therefore came to the ER for further evaluation. Patient denies any nausea vomiting fevers or chills abdominal pain chest pain shortness of breath or any other associated symptoms presently.  PAST MEDICAL HISTORY:   Past Medical History:  Diagnosis Date  . Cardiac arrest (HCC) 1990   . Chronic kidney disease   . Complication of anesthesia    slow to wake up   . Eczema   . Headache   . Hypothyroidism   . Lower extremity edema   . Pneumonia    hx of   . Weakness of both legs    knees  . WPW (Wolff-Parkinson-White syndrome)     PAST SURGICAL HISTORY:   Past Surgical History:  Procedure Laterality Date  . ABDOMINAL HYSTERECTOMY    . BREAST ENHANCEMENT SURGERY Bilateral   . CATARACT EXTRACTION Bilateral   . CYSTOSCOPY W/ RETROGRADES Bilateral 12/28/2014   Procedure: CYSTOSCOPY WITH RETROGRADE PYELOGRAM;  Surgeon: Vanna Scotland, MD;  Location: [facility] ORS;  Service: Urology;  Laterality: Bilateral;  . heart mapping     . TRANSURETHRAL RESECTION OF BLADDER TUMOR N/A 12/28/2014   Procedure: TRANSURETHRAL RESECTION OF  BLADDER TUMOR (TURBT);  Surgeon: Vanna Scotland, MD;  Location: [facility] ORS;  Service: Urology;  Laterality: N/A;  . wpw N/A     SOCIAL HISTORY:   Social History  Substance Use Topics  . Smoking status: Never Smoker  . Smokeless tobacco: Never Used  . Alcohol use No    FAMILY HISTORY:   Family History  Problem Relation Age of Onset  . Diabetes Mother   . Heart attack Father     DRUG ALLERGIES:   Allergies  Allergen Reactions  . Ciprofloxacin Rash    Possible reaction, unclear    REVIEW OF SYSTEMS:   Review of Systems  Constitutional: Negative for fever and weight loss.  HENT: Negative for congestion, nosebleeds and tinnitus.   Eyes: Negative for blurred vision, double vision and redness.  Respiratory: Negative for cough, hemoptysis and shortness of breath.   Cardiovascular: Negative for chest pain, orthopnea, leg swelling and PND.  Gastrointestinal: Negative for abdominal pain, diarrhea, melena, nausea and vomiting.  Genitourinary: Positive for dysuria. Negative for hematuria and urgency.  Musculoskeletal: Negative for falls and joint pain.  Neurological: Negative for dizziness, tingling, sensory change, focal weakness, seizures, weakness and headaches.  Endo/Heme/Allergies: Negative for polydipsia. Does not bruise/bleed easily.  Psychiatric/Behavioral: Negative for depression and memory loss. The patient is not nervous/anxious.     MEDICATIONS AT HOME:   Prior to Admission medications   Medication Sig Start Date End Date Taking?  Authorizing Provider  diphenhydrAMINE (BENADRYL) 25 mg capsule Take 1 capsule (25 mg total) by mouth every 8 (eight) hours as needed for itching. 09/12/16 09/12/17 Yes Anne-Caroline Sharma CovertNorman, MD  hydrocortisone valerate ointment (WESTCORT) 0.2 % Apply to affected area daily 07/31/16 07/31/17 Yes Alford Highlandichard Wieting, MD  levothyroxine (SYNTHROID, LEVOTHROID) 125 MCG tablet Take 1 tablet (125 mcg total) by mouth daily before breakfast. 08/01/16  Yes  Alford Highlandichard Wieting, MD  linezolid (ZYVOX) 600 MG tablet Take 600 mg by mouth 2 (two) times daily.   Yes Historical Provider, MD  Multiple Vitamins-Minerals (PRESERVISION AREDS 2+MULTI VIT PO) Take 1 capsule by mouth 2 (two) times daily.   Yes Historical Provider, MD  potassium chloride (K-DUR) 10 MEQ tablet Take 1 tablet (10 mEq total) by mouth daily. 07/31/16  Yes Alford Highlandichard Wieting, MD  vitamin B-12 500 MCG tablet Take 1 tablet (500 mcg total) by mouth daily. 08/01/16  Yes Alford Highlandichard Wieting, MD  haloperidol (HALDOL) 0.5 MG tablet Take 1 tablet (0.5 mg total) by mouth 4 (four) times daily. Patient not taking: Reported on 11/02/2016 07/31/16   Alford Highlandichard Wieting, MD      VITAL SIGNS:  Blood pressure 137/76, pulse 96, temperature 97.4 F (36.3 C), temperature source Oral, resp. rate 14, height 5\' 8"  (1.727 m), weight 68 kg (150 lb), SpO2 98 %.  PHYSICAL EXAMINATION:  Physical Exam  GENERAL:  76 y.o.-year-old patient lying in bed in no acute distress.  EYES: Pupils equal, round, reactive to light and accommodation. No scleral icterus. Extraocular muscles intact.  HEENT: Head atraumatic, normocephalic. Oropharynx and nasopharynx clear. No oropharyngeal erythema, moist oral mucosa  NECK:  Supple, no jugular venous distention. No thyroid enlargement, no tenderness.  LUNGS: Normal breath sounds bilaterally, no wheezing, rales, rhonchi. No use of accessory muscles of respiration.  CARDIOVASCULAR: S1, S2 RRR. No murmurs, rubs, gallops, clicks.  ABDOMEN: Soft, nontender, nondistended. Bowel sounds present. No organomegaly or mass.  EXTREMITIES: No pedal edema, cyanosis, or clubbing. + 2 pedal & radial pulses b/l.   NEUROLOGIC: Cranial nerves II through XII are intact. No focal Motor or sensory deficits appreciated b/l.  PSYCHIATRIC: The patient is alert and oriented x 3. Good affect.  SKIN: No obvious rash, lesion, or ulcer.   LABORATORY PANEL:   CBC  Recent Labs Lab 11/02/16 1406  WBC 18.5*  HGB  12.8  HCT 38.5  PLT 359   ------------------------------------------------------------------------------------------------------------------  Chemistries   Recent Labs Lab 11/02/16 1406  NA 137  K 4.8  CL 102  CO2 26  GLUCOSE 124*  BUN 27*  CREATININE 1.05*  CALCIUM 9.1   ------------------------------------------------------------------------------------------------------------------  Cardiac Enzymes No results for input(s): TROPONINI in the last 168 hours. ------------------------------------------------------------------------------------------------------------------  RADIOLOGY:  No results found.   IMPRESSION AND PLAN:   76 year old female with past medical history of WPW, hypothyroidism, history of chronic kidney disease, who presents to the hospital due to ongoing dysuria.  1. Urinary tract infection-patient has a history of a previous VRE UTI. She was started on Zyvox at the assisted living but continues to have worsening symptoms and noted to have a leukocytosis. Her urinalysis is positive for UTI. -I will empirically start her on IV cefepime, oral Zyvox. Get infectious disease consult. -Follow clinically. Will place on Pyridium and monitor.   2. Leukocytosis-secondary to the urinary tract infection. Follow white cell count with IV antibiotic therapy.  3. Hypothyroidism-continue Synthroid.    All the records are reviewed and case discussed with ED provider. Management plans discussed with the patient,  family and they are in agreement.  CODE STATUS: Full Code  TOTAL TIME TAKING CARE OF THIS PATIENT: 40 minutes.    Houston Siren M.D on 11/02/2016 at 5:32 PM  Between 7am to 6pm - Pager - [phone]  After 6pm go to [email or web address] - password [email or web address] [name]  [facility]  Office  [phone]  CC: Primary care physician; No PCP Per Patient

## 2016-11-02 NOTE — ED Provider Notes (Signed)
[facility] Emergency Department Provider Note   ____________________________________________   First MD Initiated Contact with Patient 11/02/16 1507     (approximate)  I have reviewed the triage vital signs and the nursing notes.   HISTORY  Chief Complaint Dysuria  EM caveat: Somewhat limited due to patient's dementia/cognitive  HPI Marie Mullen is a 76 y.o. female history of previous cardiac arrest, chronic kidney disease, multiple and recurring UTIs  Patient states that about 3 days ago she was started on antibiotic, review of care records demonstrates she is placed on Zyvox, for dysuria and feeling like she has "another UTI".  She reports she's had ongoing and increasing burning with urination, decreased appetite, loses assisted living but did not go to lunch/breakfast today as she did not feel well. Denies nausea. Reports decreased oral intake. She has felt warm and chilled at times, denies any cough. No chest pain. No nausea or vomiting.   Past Medical History:  Diagnosis Date  . Cardiac arrest (HCC) 1990   . Chronic kidney disease   . Complication of anesthesia    slow to wake up   . Eczema   . Headache   . Hypothyroidism   . Lower extremity edema   . Pneumonia    hx of   . Weakness of both legs    knees  . WPW (Wolff-Parkinson-White syndrome)     Patient Active Problem List   Diagnosis Date Noted  . Acute delirium 07/28/2016  . 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 Cerebrovascular disease 07/28/2016  . UTI (urinary tract infection) 07/27/2016  . Encephalopathy 09/15/2015  . Urinary tract infection 09/15/2015  . CKD (chronic kidney disease) stage 2, GFR 60-89 ml/min 07/08/2015  . Hypothyroidism 07/08/2015  . Bladder mass 07/08/2015    Past Surgical History:  Procedure Laterality Date  . ABDOMINAL HYSTERECTOMY    . BREAST ENHANCEMENT SURGERY  Bilateral   . CATARACT EXTRACTION Bilateral   . CYSTOSCOPY W/ RETROGRADES Bilateral 12/28/2014   Procedure: CYSTOSCOPY WITH RETROGRADE PYELOGRAM;  Surgeon: Vanna Scotland, MD;  Location: [facility] ORS;  Service: Urology;  Laterality: Bilateral;  . heart mapping     . TRANSURETHRAL RESECTION OF BLADDER TUMOR N/A 12/28/2014   Procedure: TRANSURETHRAL RESECTION OF BLADDER TUMOR (TURBT);  Surgeon: Vanna Scotland, MD;  Location: [facility] ORS;  Service: Urology;  Laterality: N/A;  . wpw N/A     Prior to Admission medications   Medication Sig Start Date End Date Taking? Authorizing Provider  diphenhydrAMINE (BENADRYL) 25 mg capsule Take 1 capsule (25 mg total) by mouth every 8 (eight) hours as needed for itching. 09/12/16 09/12/17 Yes Anne-Caroline Sharma Covert, MD  hydrocortisone valerate ointment (WESTCORT) 0.2 % Apply to affected area daily 07/31/16 07/31/17 Yes Alford Highland, MD  levothyroxine (SYNTHROID, LEVOTHROID) 125 MCG tablet Take 1 tablet (125 mcg total) by mouth daily before breakfast. 08/01/16  Yes Alford Highland, MD  linezolid (ZYVOX) 600 MG tablet Take 600 mg by mouth 2 (two) times daily.   Yes Historical Provider, MD  Multiple Vitamins-Minerals (PRESERVISION AREDS 2+MULTI VIT PO) Take 1 capsule by mouth 2 (two) times daily.   Yes Historical Provider, MD  potassium chloride (K-DUR) 10 MEQ tablet Take 1 tablet (10 mEq total) by mouth daily. 07/31/16  Yes Alford Highland, MD  vitamin B-12 500 MCG tablet Take 1 tablet (500 mcg total) by mouth daily. 08/01/16  Yes Alford Highland, MD  haloperidol (HALDOL) 0.5 MG tablet Take  1 tablet (0.5 mg total) by mouth 4 (four) times daily. Patient not taking: Reported on 11/02/2016 07/31/16   Alford Highlandichard Wieting, MD    Allergies Ciprofloxacin  Family History  Problem Relation Age of Onset  . Diabetes Mother   . Heart attack Father     Social History Social History  Substance Use Topics  . Smoking status: Never Smoker  . Smokeless tobacco: Never Used  .  Alcohol use No    Review of Systems Constitutional: Fevers chills fatigue Eyes: No visual changes. ENT: No sore throat. Cardiovascular: Denies chest pain. Respiratory: Denies shortness of breath. Gastrointestinal: No abdominal pain.  No nausea, no vomiting.  No diarrhea.  No constipation. Genitourinary: See history of present illness Musculoskeletal: Negative for back pain. Skin: Negative for rash. Neurological: Negative for headaches, focal weakness or numbness.  EM caveat  ____________________________________________   PHYSICAL EXAM:  VITAL SIGNS: ED Triage Vitals  Enc Vitals Group     BP 11/02/16 1400 140/76     Pulse Rate 11/02/16 1400 95     Resp 11/02/16 1400 16     Temp 11/02/16 1401 97.4 F (36.3 C)     Temp Source 11/02/16 1401 Oral     SpO2 11/02/16 1400 97 %     Weight 11/02/16 1400 150 lb (68 kg)     Height 11/02/16 1400 5\' 8"  (1.727 m)     Head Circumference --      Peak Flow --      Pain Score 11/02/16 1401 5     Pain Loc --      Pain Edu? --      Excl. in GC? --     Constitutional: Alert and oriented. There is mildly ill, pleasant but in no distress.  Eyes: Conjunctivae are normal. PERRL. EOMI. Head: Atraumatic. Nose: No congestion/rhinnorhea. Mouth/Throat: Mucous membranes are dry.  Oropharynx non-erythematous. Neck: No stridor.   Cardiovascular: Normal rate, regular rhythm. Grossly normal heart sounds.  Good peripheral circulation. Respiratory: Normal respiratory effort.  No retractions. Lungs CTAB. Gastrointestinal: Soft and nontender set for tenderness suprapubically. No distention.  Patient has notable right-sided CVA tenderness, none on the left. Musculoskeletal: No lower extremity tenderness nor edema.   Neurologic:  Normal speech and language. No gross focal neurologic deficits are appreciated. Skin:  Skin is warm, dry and intact. No rash noted. Psychiatric: Mood and affect are normal. Speech and behavior are  normal.  ____________________________________________   LABS (all labs ordered are listed, but only abnormal results are displayed)  Labs Reviewed  BASIC METABOLIC PANEL - Abnormal; Notable for the following:       Result Value   Glucose, Bld 124 (*)    BUN 27 (*)    Creatinine, Ser 1.05 (*)    GFR calc non Af Amer 50 (*)    GFR calc Af Amer 58 (*)    All other components within normal limits  CBC - Abnormal; Notable for the following:    WBC 18.5 (*)    All other components within normal limits  URINALYSIS, COMPLETE (UACMP) WITH MICROSCOPIC - Abnormal; Notable for the following:    Color, Urine AMBER (*)    APPearance CLOUDY (*)    Hgb urine dipstick MODERATE (*)    Ketones, ur 20 (*)    Protein, ur 100 (*)    Nitrite POSITIVE (*)    Leukocytes, UA SMALL (*)    Bacteria, UA MANY (*)    Squamous Epithelial / LPF 0-5 (*)  All other components within normal limits  URINE CULTURE  CULTURE, BLOOD (ROUTINE X 2)  CULTURE, BLOOD (ROUTINE X 2)  LACTIC ACID, PLASMA   ____________________________________________  EKG   ____________________________________________  RADIOLOGY   ____________________________________________   PROCEDURES  Procedure(s) performed: None  Procedures  Critical Care performed: No  ____________________________________________   INITIAL IMPRESSION / ASSESSMENT AND PLAN / ED COURSE  Pertinent labs & imaging results that were available during my care of the patient were reviewed by me and considered in my medical decision making (see chart for details).  Patient presents for increasing dysuria. On treatment for urinary tract infection, now with symptoms of right-sided CVA tenderness suspicious for pyelonephritis based on clinical exam. Her white count is elevated, on antibiotic for 3-4 days with ongoing questionable worsening symptoms.      ----------------------------------------- 4:55 PM on  11/02/2016 -----------------------------------------  Case discussed with Dr. Sampson Goon, recommends oral linezolid and IV cefepime and narrowing antibiotics based upon pending cultures.  ____________________________________________   FINAL CLINICAL IMPRESSION(S) / ED DIAGNOSES  Final diagnoses:  Lower urinary tract infection, acute  Failure of outpatient treatment      NEW MEDICATIONS STARTED DURING THIS VISIT:  New Prescriptions   No medications on file     Note:  This document was prepared using Dragon voice recognition software and may include unintentional dictation errors.     Sharyn Creamer, MD 11/02/16 [phone]

## 2016-11-02 NOTE — ED Notes (Signed)
Pt resting in bed, resp even and unlabored, pt aware of pending admission 

## 2016-11-02 NOTE — Progress Notes (Signed)
Pharmacy Antibiotic Note  Marie Mullen is a 76 y.o. female admitted on 11/02/2016 with UTI.  Pharmacy has been consulted for cefepime dosing. Patient is also receiving linezolid for possible VRE infection.   Plan: ID consulted- Dr. Sampson GoonFitzgerald recommended cefepime and linezolid  Will start cefepime 1gm IV every 24 hours based on current crcl <5360ml/min.   Height: 5\' 8"  (172.7 cm) Weight: 150 lb (68 kg) IBW/kg (Calculated) : 63.9  Temp (24hrs), Avg:97.9 F (36.6 C), Min:97.4 F (36.3 C), Max:98.4 F (36.9 C)   Recent Labs Lab 11/02/16 1406 11/02/16 1555  WBC 18.5*  --   CREATININE 1.05*  --   LATICACIDVEN  --  1.2    Estimated Creatinine Clearance: 45.3 mL/min (A) (by C-G formula based on SCr of 1.05 mg/dL (H)).    Allergies  Allergen Reactions  . Ciprofloxacin Rash    Possible reaction, unclear    Antimicrobials this admission: 3/16 cefepime >>  3/16 linezolid >>  Dose adjustments this admission:  Microbiology results: 3/16 BCx: pending 3/16 ONG:EXBMWUXCx:pending   Thank you for allowing pharmacy to be a part of this patient's care.  Gardner CandleSheema M Kambrie Eddleman, PharmD, BCPS Clinical Pharmacist 11/02/2016 7:41 PM

## 2016-11-02 NOTE — ED Notes (Signed)
Pt resting in bed, denies any needs, resp even and unlabored 

## 2016-11-03 LAB — CBC
HCT: 36.9 % (ref 35.0–47.0)
HEMOGLOBIN: 12.3 g/dL (ref 12.0–16.0)
MCH: 29.3 pg (ref 26.0–34.0)
MCHC: 33.4 g/dL (ref 32.0–36.0)
MCV: 87.5 fL (ref 80.0–100.0)
Platelets: 320 10*3/uL (ref 150–440)
RBC: 4.21 MIL/uL (ref 3.80–5.20)
RDW: 13.8 % (ref 11.5–14.5)
WBC: 14.2 10*3/uL — ABNORMAL HIGH (ref 3.6–11.0)

## 2016-11-03 LAB — BASIC METABOLIC PANEL
ANION GAP: 8 (ref 5–15)
BUN: 26 mg/dL — AB (ref 6–20)
CO2: 25 mmol/L (ref 22–32)
Calcium: 8.8 mg/dL — ABNORMAL LOW (ref 8.9–10.3)
Chloride: 107 mmol/L (ref 101–111)
Creatinine, Ser: 0.94 mg/dL (ref 0.44–1.00)
GFR calc Af Amer: >60 mL/min (ref >60)
GFR calc non Af Amer: 57 mL/min — ABNORMAL LOW (ref >60)
GLUCOSE: 101 mg/dL — AB (ref 65–99)
Potassium: 4 mmol/L (ref 3.5–5.1)
Sodium: 140 mmol/L (ref 135–145)

## 2016-11-03 NOTE — Progress Notes (Signed)
[facility] -  at [facility]   PATIENT NAME: Marie Mullen    MR#:  [id number]  DATE OF BIRTH:  06-Dec-1940  SUBJECTIVE:  Came in with symptoms of dysuria, recurrent eating breakfast. Denies any complaints other than burning sensation in the urine and frequency  REVIEW OF SYSTEMS:   Review of Systems  Constitutional: Negative for chills, fever and weight loss.  HENT: Negative for ear discharge, ear pain and nosebleeds.   Eyes: Negative for blurred vision, pain and discharge.  Respiratory: Negative for sputum production, shortness of breath, wheezing and stridor.   Cardiovascular: Negative for chest pain, palpitations, orthopnea and PND.  Gastrointestinal: Negative for abdominal pain, diarrhea, nausea and vomiting.  Genitourinary: Positive for dysuria and frequency. Negative for urgency.  Musculoskeletal: Negative for back pain and joint pain.  Neurological: Positive for weakness. Negative for sensory change, speech change and focal weakness.  Psychiatric/Behavioral: Negative for depression and hallucinations. The patient is not nervous/anxious.    Tolerating Diet: Yes Tolerating PT: Pending  DRUG ALLERGIES:   Allergies  Allergen Reactions  . Ciprofloxacin Rash    Possible reaction, unclear    VITALS:  Blood pressure 137/76, pulse 94, temperature 97.5 F (36.4 C), temperature source Oral, resp. rate 18, height 5\' 8"  (1.727 m), weight 68 kg (150 lb), SpO2 96 %.  PHYSICAL EXAMINATION:   Physical Exam  GENERAL:  76 y.o.-year-old patient lying in the bed with no acute distress. Thin EYES: Pupils equal, round, reactive to light and accommodation. No scleral icterus. Extraocular muscles intact.  HEENT: Head atraumatic, normocephalic. Oropharynx and nasopharynx clear.  NECK:  Supple, no jugular venous distention. No thyroid enlargement, no tenderness.  LUNGS: Normal breath sounds bilaterally, no wheezing, rales, rhonchi. No use of accessory muscles  of respiration.  CARDIOVASCULAR: S1, S2 normal. No murmurs, rubs, or gallops.  ABDOMEN: Soft, nontender, nondistended. Bowel sounds present. No organomegaly or mass.  EXTREMITIES: No cyanosis, clubbing or edema b/l.    NEUROLOGIC: Cranial nerves II through XII are intact. No focal Motor or sensory deficits b/l.   PSYCHIATRIC:  patient is alert and oriented x 3.  SKIN: No obvious rash, lesion, or ulcer.   LABORATORY PANEL:  CBC  Recent Labs Lab 11/03/16 0520  WBC 14.2*  HGB 12.3  HCT 36.9  PLT 320    Chemistries   Recent Labs Lab 11/03/16 0520  NA 140  K 4.0  CL 107  CO2 25  GLUCOSE 101*  BUN 26*  CREATININE 0.94  CALCIUM 8.8*   Cardiac Enzymes No results for input(s): TROPONINI in the last 168 hours. RADIOLOGY:  No results found. ASSESSMENT AND PLAN:  76 year old female with past medical history of WPW, hypothyroidism, history of chronic kidney disease, who presents to the hospital due to ongoing dysuria.  1. Urinary tract infection-patient has a history of a previous VRE UTI. She was started on Zyvox at the assisted living but continues to have worsening symptoms and noted to have a leukocytosis. Her urinalysis is positive for UTI. -empirically on IV cefepime, oral Zyvox. Get infectious disease consult. -Follow clinically. Will place on Pyridium and monitor.   2. Leukocytosis-secondary to the urinary tract infection. Follow white cell count with IV antibiotic therapy.  3. Hypothyroidism-continue Synthroid.  Case discussed with Care Management/Social Worker. Management plans discussed with the patient, family and they are in agreement.  CODE STATUS: Full  DVT Prophylaxis: Lovenox  TOTAL TIME TAKING CARE OF THIS PATIENT: 25 minutes.  >50% time spent on counselling  and coordination of care  POSSIBLE D/C IN one to 2 DAYS, DEPENDING ON CLINICAL CONDITION.  Note: This dictation was prepared with Dragon dictation along with smaller phrase technology. Any  transcriptional errors that result from this process are unintentional.  Clayten Allcock M.D on 11/03/2016 at 1:33 PM  Between 7am to 6pm - Pager - [phone]  After 6pm go to [email or web address] - Social research officer, governmentpassword [email or web address]  [facility]  Office  [phone]  CC: Primary care physician; No PCP Per Patient

## 2016-11-04 NOTE — Evaluation (Signed)
Physical Therapy Evaluation Patient Details Name: Marie Mullen MRN: [id number] DOB: 1941/04/17 Today's Date: 11/04/2016   History of Present Illness  76 yo female with onset of UTI with leukocytosis, PMHx:  Marie Mullen syndrome, cardiac arrest, CKD, HA, LE edema, PNA, UTI, hypothyroid,   Clinical Impression  Pt is noted to have significant weakness if her PLOF was to ambulate in ALF.  Her report is that she did but is confused.  Nursing helped PT get pt back to bed from [facility] as pt basically did not assist much to stand after being able to get in the [facility] with just PT without walker.  Will follow acutely for strengthening and endurance training of standing and transfers to progress to gait as she is able to tolerate.    Follow Up Recommendations SNF    Equipment Recommendations  None recommended by PT    Recommendations for Other Services       Precautions / Restrictions Precautions Precautions: Fall Restrictions Weight Bearing Restrictions: No      Mobility  Bed Mobility Overal bed mobility: Needs Assistance Bed Mobility: Supine to Sit;Sit to Supine     Supine to sit: Mod assist Sit to supine: Max assist   General bed mobility comments: pt cannot lift her legs due to weakness and pain  Transfers Overall transfer level: Needs assistance Equipment used: Rolling walker (2 wheeled);1 person hand held assist Transfers: Sit to/from [employer] Sit to Stand: Mod assist;Max assist;+2 physical assistance;+2 safety/equipment;From elevated surface (mod from elevated and 2 max from lower height) Stand pivot transfers: Mod assist;From elevated surface       General transfer comment: used no AD to bsc but return with walker and 2 person assist  Ambulation/Gait             General Gait Details: unable to stand enough  Stairs            Wheelchair Mobility    Modified Rankin (Stroke Patients Only)       Balance Overall balance  assessment: Needs assistance Sitting-balance support: Feet supported;Bilateral upper extremity supported Sitting balance-Leahy Scale: Fair   Postural control: Posterior lean Standing balance support: Bilateral upper extremity supported Standing balance-Leahy Scale: Poor                               Pertinent Vitals/Pain Pain Assessment: Faces Faces Pain Scale: Hurts whole lot Pain Location: abdomen with effort to stand Pain Descriptors / Indicators: Jabbing Pain Intervention(s): Limited activity within patient's tolerance;Monitored during session;Premedicated before session;Repositioned    Home Living Family/patient expects to be discharged to:: Assisted living     Type of Home: Assisted living         Home Equipment: Other (comment);Walker - 2 wheels;Cane - single point (Pt is stating she has SPC and walker but is confused)      Prior Function Level of Independence: Needs assistance   Gait / Transfers Assistance Needed: pt reports independent gait with AD  ADL's / Homemaking Assistance Needed: lives in ALF for household assistance with cooking and cleaning        Hand Dominance        Extremity/Trunk Assessment   Upper Extremity Assessment Upper Extremity Assessment: Generalized weakness    Lower Extremity Assessment Lower Extremity Assessment: Generalized weakness    Cervical / Trunk Assessment Cervical / Trunk Assessment: Kyphotic  Communication   Communication: No difficulties  Cognition Arousal/Alertness: Awake/alert  Behavior During Therapy: Flat affect Overall Cognitive Status: No family/caregiver present to determine baseline cognitive functioning                 General Comments: pt is somewhat confused but no family or ALF staff to determine    General Comments      Exercises     Assessment/Plan    PT Assessment Patient needs continued PT services  PT Problem List Decreased range of motion;Decreased strength;Decreased  activity tolerance;Decreased balance;Decreased mobility;Decreased coordination;Decreased knowledge of use of DME;Decreased cognition;Decreased safety awareness;Pain       PT Treatment Interventions DME instruction;Gait training;Functional mobility training;Therapeutic activities;Therapeutic exercise;Balance training;Neuromuscular re-education;Patient/family education    PT Goals (Current goals can be found in the Care Plan section)  Acute Rehab PT Goals Patient Stated Goal[facility] Goal Formulation: Patient unable to participate in goal setting Time For Goal Achievement: 11/18/16 Potential to Achieve Goals: Good    Frequency Min 2X/week   Barriers to discharge   ALF may not have staffing to use 2 person assist    Co-evaluation               End of Session Equipment Utilized During Treatment: Gait belt Activity Tolerance: Patient limited by fatigue;Patient limited by lethargy;Other (comment);Patient limited by pain (pain in her abdomen with effort) Patient left: in bed;with call bell/phone within reach;with bed alarm set;with nursing/sitter in room Nurse Communication: Mobility status PT Visit Diagnosis: Muscle weakness (generalized) (M62.81);Other abnormalities of gait and mobility (R26.89)    Functional Assessment Tool Used: AM-PAC 6 Clicks Basic Mobility    Time: [id number] PT Time Calculation (min) (ACUTE ONLY): 28 min   Charges:   PT Evaluation $PT Eval Moderate Complexity: 1 Procedure PT Treatments $Therapeutic Activity: 8-22 mins   PT G Codes:   PT G-Codes **NOT FOR INPATIENT CLASS** Functional Assessment Tool Used: AM-PAC 6 Clicks Basic Mobility     Ivar DrapeRuth E Tsugio Elison 11/04/2016, 3:24 PM   Samul Dadauth Kollyns Mickelson, PT MS Acute Rehab Dept. Number: [facility] [phone] and [facility] [phone]

## 2016-11-04 NOTE — Progress Notes (Signed)
[facility]   PATIENT NAME: Marie Mullen    MR#:  [id number]  DATE OF BIRTH:  1941/03/15  SUBJECTIVE:  Came in with symptoms of dysuria, recurrent eating breakfast. Denies any complaints other than burning sensation in the urine and frequency  REVIEW OF SYSTEMS:   Review of Systems  Constitutional: Negative for chills, fever and weight loss.  HENT: Negative for ear discharge, ear pain and nosebleeds.   Eyes: Negative for blurred vision, pain and discharge.  Respiratory: Negative for sputum production, shortness of breath, wheezing and stridor.   Cardiovascular: Negative for chest pain, palpitations, orthopnea and PND.  Gastrointestinal: Negative for abdominal pain, diarrhea, nausea and vomiting.  Genitourinary: Positive for dysuria and frequency. Negative for urgency.  Musculoskeletal: Negative for back pain and joint pain.  Neurological: Positive for weakness. Negative for sensory change, speech change and focal weakness.  Psychiatric/Behavioral: Negative for depression and hallucinations. The patient is not nervous/anxious.    Tolerating Diet: Yes Tolerating PT: Pending  DRUG ALLERGIES:   Allergies  Allergen Reactions  . Ciprofloxacin Rash    Possible reaction, unclear    VITALS:  Blood pressure 126/83, pulse 96, temperature 97.9 F (36.6 C), temperature source Oral, resp. rate 18, height 5\' 8"  (1.727 m), weight 68 kg (150 lb), SpO2 97 %.  PHYSICAL EXAMINATION:   Physical Exam  GENERAL:  76 y.o.-year-old patient lying in the bed with no acute distress. Thin EYES: Pupils equal, round, reactive to light and accommodation. No scleral icterus. Extraocular muscles intact.  HEENT: Head atraumatic, normocephalic. Oropharynx and nasopharynx clear.  NECK:  Supple, no jugular venous distention. No thyroid enlargement, no tenderness.  LUNGS: Normal breath sounds bilaterally, no wheezing, rales, rhonchi. No use of accessory muscles  of respiration.  CARDIOVASCULAR: S1, S2 normal. No murmurs, rubs, or gallops.  ABDOMEN: Soft, nontender, nondistended. Bowel sounds present. No organomegaly or mass.  EXTREMITIES: No cyanosis, clubbing or edema b/l.    NEUROLOGIC: Cranial nerves II through XII are intact. No focal Motor or sensory deficits b/l.   PSYCHIATRIC:  patient is alert and oriented x 3.  SKIN: No obvious rash, lesion, or ulcer.   LABORATORY PANEL:  CBC  Recent Labs Lab 11/03/16 0520  WBC 14.2*  HGB 12.3  HCT 36.9  PLT 320    Chemistries   Recent Labs Lab 11/03/16 0520  NA 140  K 4.0  CL 107  CO2 25  GLUCOSE 101*  BUN 26*  CREATININE 0.94  CALCIUM 8.8*   Cardiac Enzymes No results for input(s): TROPONINI in the last 168 hours. RADIOLOGY:  No results found. ASSESSMENT AND PLAN:  76 year old female with past medical history of WPW, hypothyroidism, history of chronic kidney disease, who presents to the hospital due to ongoing dysuria.  1. Urinary tract infection-patient has a history of a previous VRE UTI. - She was started on Zyvox at the assisted living but continues to have worsening symptoms and noted to have a leukocytosis. Her urinalysis is positive for UTI. Urine culture growing kleb Pneumonia. Sensitivities Pending. -empirically on IV cefepime, oral Zyvox. Get infectious disease consult. -Follow clinically. Will place on Pyridium   2. Leukocytosis-secondary to the urinary tract infection. Follow white cell count with IV antibiotic therapy.  3. Hypothyroidism-continue Synthroid.  4. PT to see  Case discussed with Care Management/Social Worker. Management plans discussed with the patient, family and they are in agreement.  CODE STATUS: Full  DVT Prophylaxis: Lovenox  TOTAL TIME TAKING CARE  OF THIS PATIENT: 25 minutes.  >50% time spent on counselling and coordination of care  POSSIBLE D/C IN one to 2 DAYS, DEPENDING ON CLINICAL CONDITION.  Note: This dictation was prepared  with Dragon dictation along with smaller phrase technology. Any transcriptional errors that result from this process are unintentional.  Xayvier Vallez M.D on 11/04/2016 at 12:23 PM  Between 7am to 6pm - Pager - [phone]  After 6pm go to [email or web address] - Social research officer, governmentpassword EPAS [email or web address]  [facility]  Office  [phone]  CC: Primary care physician; No PCP Per Patient

## 2016-11-05 LAB — URINE CULTURE
Culture: >=100000 — AB
Special Requests: NORMAL

## 2016-11-05 NOTE — Progress Notes (Signed)
[facility] [place] [facility]   PATIENT NAME: Marie Mullen    MR#:  [id number]  DATE OF BIRTH:  05-24-1941  SUBJECTIVE:  Came in with symptoms of dysuria, recurrent eating breakfast. Denies any complaints other than burning sensation in the urine and frequency- have confusion today.  REVIEW OF SYSTEMS:   Review of Systems  Constitutional: Negative for chills, fever and weight loss.  HENT: Negative for ear discharge, ear pain and nosebleeds.   Eyes: Negative for blurred vision, pain and discharge.  Respiratory: Negative for sputum production, shortness of breath, wheezing and stridor.   Cardiovascular: Negative for chest pain, palpitations, orthopnea and PND.  Gastrointestinal: Negative for abdominal pain, diarrhea, nausea and vomiting.  Genitourinary: Positive for dysuria and frequency. Negative for urgency.  Musculoskeletal: Negative for back pain and joint pain.  Neurological: Positive for weakness. Negative for sensory change, speech change and focal weakness.  Psychiatric/Behavioral: Negative for depression and hallucinations. The patient is not nervous/anxious.    Tolerating Diet: Yes Tolerating PT: Pending  DRUG ALLERGIES:   Allergies  Allergen Reactions  . Ciprofloxacin Rash    Possible reaction, unclear    VITALS:  Blood pressure 139/79, pulse (!) 109, temperature 97.6 F (36.4 C), temperature source Oral, resp. rate 19, height 5\' 8"  (1.727 m), weight 68 kg (150 lb), SpO2 94 %.  PHYSICAL EXAMINATION:   Physical Exam  GENERAL:  76 y.o.-year-old patient lying in the bed with no acute distress. Thin EYES: Pupils equal, round, reactive to light and accommodation. No scleral icterus. Extraocular muscles intact.  HEENT: Head atraumatic, normocephalic. Oropharynx and nasopharynx clear.  NECK:  Supple, no jugular venous distention. No thyroid enlargement, no tenderness.  LUNGS: Normal breath sounds bilaterally, no wheezing, rales, rhonchi.  No use of accessory muscles of respiration.  CARDIOVASCULAR: S1, S2 normal. No murmurs, rubs, or gallops.  ABDOMEN: Soft, nontender, nondistended. Bowel sounds present. No organomegaly or mass.  EXTREMITIES: No cyanosis, clubbing or edema b/l.    NEUROLOGIC: Cranial nerves II through XII are intact. No focal Motor or sensory deficits b/l.   PSYCHIATRIC:  patient is alert and not well oriented.  SKIN: No obvious rash, lesion, or ulcer.   LABORATORY PANEL:  CBC  Recent Labs Lab 11/03/16 0520  WBC 14.2*  HGB 12.3  HCT 36.9  PLT 320    Chemistries   Recent Labs Lab 11/03/16 0520  NA 140  K 4.0  CL 107  CO2 25  GLUCOSE 101*  BUN 26*  CREATININE 0.94  CALCIUM 8.8*   Cardiac Enzymes No results for input(s): TROPONINI in the last 168 hours. RADIOLOGY:  No results found. ASSESSMENT AND PLAN:  76 year old female with past medical history of WPW, hypothyroidism, history of chronic kidney disease, who presents to the hospital due to ongoing dysuria.  1. Urinary tract infection-patient has a history of a previous VRE UTI. - She was started on Zyvox at the assisted living but continues to have worsening symptoms and noted to have a leukocytosis. Her urinalysis is positive for UTI. Urine culture growing kleb Pneumonia. Sensitivities arrived. -empirically on IV cefepime, oral Zyvox. awaited infectious disease consult. -Follow clinically.  on Pyridium   2. Leukocytosis-secondary to the urinary tract infection. Follow white cell count with IV antibiotic therapy.  3. Hypothyroidism-continue Synthroid.  4. PT to see.  Case discussed with Care Management/Social Worker. Management plans discussed with the patient, family and they are in agreement.  CODE STATUS: Full  DVT Prophylaxis: Lovenox  TOTAL  TIME TAKING CARE OF THIS PATIENT: 35 minutes.  >50% time spent on counselling and coordination of care I called the patient's sister on phone and discussed about her overall  condition, as per her last few months patient has progressive worsening of her dementia and she had to be moved to assisted living place. She also have a decreased oral intake. Sisters are working on guardianship.  POSSIBLE D/C IN one to 2 DAYS, DEPENDING ON CLINICAL CONDITION.  Note: This dictation was prepared with Dragon dictation along with smaller phrase technology. Any transcriptional errors that result from this process are unintentional.  Altamese Dilling M.D on 11/05/2016 at 2:37 PM  Between 7am to 6pm - Pager - [phone]  After 6pm go to [email or web address] - Social research officer, government  [facility]  Office  [phone]  CC: Primary care physician; No PCP Per Patient

## 2016-11-05 NOTE — Consult Note (Signed)
[facility] Infectious Disease     Reason for Consult: UTI   Referring Physician: Dolores Frame Date of Admission:  11/02/2016   Active Problems:   UTI (urinary tract infection)   HPI: Marie Mullen is a 76 y.o. female admitted with dysuria in setting of prior recurrent UTIs. On admit she had wbc 18, but AF. UA with TNTC WBC. She has hx prior VRE UTI and was started empirically on zyvox a few days prior to admission but worsensed. UCX this admit is growing > 100 K Klebsiella Pna  She had CT scan done 09/19/16 with bladder wall thickening and edema worrisome for neoplasm and cystoscopy was recommended. At that time she had UA + and UCX with vanco sensitive Enterococcus. She was apparently dced on oral zyvox for 10 days at that time.  Currently she is alone in room and unable to provide any history as to why she is here  She has a hx of Hypertension, chronic kidney disease, hypothyroidism, history of WPW, history of previous VRE UTI.  Past Medical History:  Diagnosis Date  . Cardiac arrest ([place]) 1990   . Chronic kidney disease   . Complication of anesthesia    slow to wake up   . Eczema   . Headache   . Hypothyroidism   . Lower extremity edema   . Pneumonia    hx of   . Weakness of both legs    knees  . WPW (Wolff-Parkinson-White syndrome)    Past Surgical History:  Procedure Laterality Date  . ABDOMINAL HYSTERECTOMY    . BREAST ENHANCEMENT SURGERY Bilateral   . CATARACT EXTRACTION Bilateral   . CYSTOSCOPY W/ RETROGRADES Bilateral 12/28/2014   Procedure: CYSTOSCOPY WITH RETROGRADE PYELOGRAM;  Surgeon: Hollice Espy, MD;  Location: [facility] ORS;  Service: Urology;  Laterality: Bilateral;  . heart mapping     . TRANSURETHRAL RESECTION OF BLADDER TUMOR N/A 12/28/2014   Procedure: TRANSURETHRAL RESECTION OF BLADDER TUMOR (TURBT);  Surgeon: Hollice Espy, MD;  Location: [facility] ORS;  Service: Urology;  Laterality: N/A;  . wpw N/A    Social History  Substance Use Topics  . Smoking  status: Never Smoker  . Smokeless tobacco: Never Used  . Alcohol use No   Family History  Problem Relation Age of Onset  . Diabetes Mother   . Heart attack Father     Allergies:  Allergies  Allergen Reactions  . Ciprofloxacin Rash    Possible reaction, unclear    Current antibiotics: Antibiotics Given (last 72 hours)    Date/Time Action Medication Dose Rate   11/02/16 1727 Given   linezolid (ZYVOX) tablet 600 mg 600 mg    11/02/16 2016 Given  [pt wants to go to sleep]   linezolid (ZYVOX) tablet 600 mg 600 mg    11/03/16 0955 Given   linezolid (ZYVOX) tablet 600 mg 600 mg    11/03/16 1805 Given   ceFEPIme (MAXIPIME) 1 GM / 31m IVPB premix 1 g 100 mL/hr   11/03/16 2134 Given   linezolid (ZYVOX) tablet 600 mg 600 mg    11/04/16 0930 Given   linezolid (ZYVOX) tablet 600 mg 600 mg    11/04/16 1716 Given   ceFEPIme (MAXIPIME) 1 GM / 543mIVPB premix 1 g 100 mL/hr   11/04/16 2200 Given   linezolid (ZYVOX) tablet 600 mg 600 mg    11/05/16 1259 Given   linezolid (ZYVOX) tablet 600 mg 600 mg       MEDICATIONS: . ceFEPIme  1 g Intravenous Q24H  . enoxaparin (LOVENOX) injection  40 mg Subcutaneous Q24H  . levothyroxine  125 mcg Oral QAC breakfast  . linezolid  600 mg Oral BID  . multivitamin-lutein  1 capsule Oral BID  . potassium chloride  10 mEq Oral Daily  . cyanocobalamin  500 mcg Oral Daily    Review of Systems - unable to obtain  OBJECTIVE: Temp:  [97.6 F (36.4 C)-98.3 F (36.8 C)] 98.3 F (36.8 C) (03/19 1508) Pulse Rate:  [100-109] 105 (03/19 1508) Resp:  [18-19] 18 (03/19 1508) BP: (136-141)/(56-84) 141/84 (03/19 1508) SpO2:  [94 %-97 %] 97 % (03/19 1508) Physical Exam  Constitutional: disoriented, lying in bed, curled up. Awakens and interacts but unable to tell me where she is or why here HENT: [place]/AT, PERRLA, no scleral icterus Mouth/Throat: Oropharynx is clear and dry . No oropharyngeal exudate.  Cardiovascular: Normal rate, regular rhythm and  normal heart sounds.  Pulmonary/Chest: Effort normal and breath sounds normal. No respiratory distress.  has no wheezes.  Neck = supple, no nuchal rigidity Abdominal: Soft. Bowel sounds are normal.  Mild ttp on exam in LLQ Lymphadenopathy: no cervical adenopathy. No axillary adenopathy Neurological: cannot tell me where she is nor when it is Skin: Skin is warm and dry. No rash noted. No erythema.  Psychiatric: flat affect  LABS: No results found for this or any previous visit (from the past 48 hour(s)). No components found for: ESR, C REACTIVE PROTEIN MICRO: Recent Results (from the past 720 hour(s))  Blood Culture (routine x 2)     Status: None (Preliminary result)   Collection Time: 11/02/16  3:55 PM  Result Value Ref Range Status   Specimen Description BLOOD RIGHT ASSIST CONTROL  Final   Special Requests BOTTLES DRAWN AEROBIC AND ANAEROBIC BCAV  Final   Culture NO GROWTH 3 DAYS  Final   Report Status PENDING  Incomplete  Blood Culture (routine x 2)     Status: None (Preliminary result)   Collection Time: 11/02/16  3:55 PM  Result Value Ref Range Status   Specimen Description BLOOD LEFT ASSIST CONTROL  Final   Special Requests BOTTLES DRAWN AEROBIC AND ANAEROBIC BCLV  Final   Culture NO GROWTH 3 DAYS  Final   Report Status PENDING  Incomplete  Urine culture     Status: Abnormal   Collection Time: 11/02/16  4:04 PM  Result Value Ref Range Status   Specimen Description URINE, CATHETERIZED  Final   Special Requests Normal  Final   Culture >=100,000 COLONIES/mL KLEBSIELLA PNEUMONIAE (A)  Final   Report Status 11/05/2016 FINAL  Final   Organism ID, Bacteria KLEBSIELLA PNEUMONIAE (A)  Final      Susceptibility   Klebsiella pneumoniae - MIC*    AMPICILLIN >=32 RESISTANT Resistant     CEFAZOLIN <=4 SENSITIVE Sensitive     CEFTRIAXONE <=1 SENSITIVE Sensitive     CIPROFLOXACIN <=0.25 SENSITIVE Sensitive     GENTAMICIN <=1 SENSITIVE Sensitive     IMIPENEM <=0.25 SENSITIVE Sensitive      NITROFURANTOIN 64 INTERMEDIATE Intermediate     TRIMETH/SULFA <=20 SENSITIVE Sensitive     AMPICILLIN/SULBACTAM 16 INTERMEDIATE Intermediate     PIP/TAZO 16 SENSITIVE Sensitive     Extended ESBL NEGATIVE Sensitive     * >=100,000 COLONIES/mL KLEBSIELLA PNEUMONIAE    IMAGING: IMPRESSION: 1. Abnormal eccentric irregular bladder wall thickening about the dome and right lateral aspect with perivesicular edema. Ill-defined low density with peripheral calcification in the region of  irregular wall thickening. 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 Recommend cystoscopy. Wall calcification versus dependent debris in the right dependent bladder. 2. Bilateral renal parenchymal scarring. Bilateral renal cysts, parapelvic on the left and exophytic on the right. Bilateral renal calcifications, parenchymal calcifications versus nonobstructing stones. 3. Abdominal aortic atherosclerosis.  No aneurysm. 4. Small hiatal hernia. Enteric contrast in the distal esophagus can be seen with reflux or slow transit.  Assessment:   MALEEYA PETERKIN is a 76 y.o. female with dementia, in a SNF, admitted with dysuria and found to have UTI with Klebsiella. She has had several recent UTIs with enterococcus (including VRE) and has a CT showing bladder wall irregularities. She provides very little history and cannot tell me why she is here, or where she is.  Fortunately her current UTI is very sensitive.  No evidence of VRE.  She is at high risk of recurrent infections and will need follow up with urology.   Recommendations Keflex 500 bid for 2 weeks Then start macrobid suppressive therapy (the Klebsiella was intermediate to macrobid, GFR 50) Would make sure she sees urology prior to 2 weeks as she would benefit from cystoscopy as otpt. If no hx gyn or estrogen sensitive cancer would start vaginal estrogen cream daily to uretheral meatus No need for ID follow up unless she develops a MDR UTI or needs IV  abx.  Thank you very much for allowing me to participate in the care of this patient. Please call with questions.   Cheral Marker. Ola Spurr, MD

## 2016-11-05 NOTE — Care Management Important Message (Signed)
Important Message  Patient Details  Name: Marie Mullen MRN: [id number] Date of Birth: [birth year]   Medicare Important Message Given:  Yes    Gwenette GreetBrenda S Nyzir Dubois, RN 11/05/2016, 10:18 AM

## 2016-11-05 NOTE — Progress Notes (Signed)
Pharmacy Antibiotic Note  Marie Mullen is a 76 y.o. female admitted on 11/02/2016 with UTI.  Pharmacy has been consulted for cefepime dosing.  ID is following. Patient has a history of VRE UTI and presented with worsening symptoms despite 3 days of linezolid PTA. Patient was started on cefepime and continued on linezolid on admission.  Day #4 cefepime Day #7 linezolid total  Plan: Continue cefepime 1 g IV daily  Urine culture growing >100K Klebsiella, sensitivities pending.   Height: 5\' 8"  (172.7 cm) Weight: 150 lb (68 kg) IBW/kg (Calculated) : 63.9  Temp (24hrs), Avg:97.8 F (36.6 C), Min:97.6 F (36.4 C), Max:97.9 F (36.6 C)   Recent Labs Lab 11/02/16 1406 11/02/16 1555 11/03/16 0520  WBC 18.5*  --  14.2*  CREATININE 1.05*  --  0.94  LATICACIDVEN  --  1.2  --     Estimated Creatinine Clearance: 50.6 mL/min (by C-G formula based on SCr of 0.94 mg/dL).    Allergies  Allergen Reactions  . Ciprofloxacin Rash    Possible reaction, unclear    Antimicrobials this admission: 3/16 cefepime >>  3/16 linezolid >>  Dose adjustments this admission:  Microbiology results: 3/16 BCx: No growth 3 days 3/16 UCx: >100K Klebsiella pneumoniae, sensitivities pending  Thank you for allowing pharmacy to be a part of this patient's care.  Cindi CarbonMary M Adolphe Fortunato, PharmD, BCPS Clinical Pharmacist 11/05/2016 7:59 AM

## 2016-11-06 LAB — CBC
HEMATOCRIT: 40.6 % (ref 35.0–47.0)
Hemoglobin: 13.5 g/dL (ref 12.0–16.0)
MCH: 28.3 pg (ref 26.0–34.0)
MCHC: 33.2 g/dL (ref 32.0–36.0)
MCV: 85.3 fL (ref 80.0–100.0)
Platelets: 407 10*3/uL (ref 150–440)
RBC: 4.76 MIL/uL (ref 3.80–5.20)
RDW: 13.9 % (ref 11.5–14.5)
WBC: 14.8 10*3/uL — ABNORMAL HIGH (ref 3.6–11.0)

## 2016-11-06 LAB — CREATININE, SERUM
Creatinine, Ser: 0.87 mg/dL (ref 0.44–1.00)
GFR calc Af Amer: >60 mL/min (ref >60)
GFR calc non Af Amer: >60 mL/min (ref >60)

## 2016-11-06 MED ORDER — CEPHALEXIN 500 MG PO CAPS: 500.0000 mg | ORAL_CAPSULE | Freq: Two times a day (BID) | ORAL | 0 refills | Status: AC

## 2016-11-06 MED ORDER — CEPHALEXIN 500 MG PO CAPS
500.0000 mg | ORAL_CAPSULE | Freq: Two times a day (BID) | ORAL | Status: DC
Start: 2016-11-06 — End: 2016-11-07
  Administered 2016-11-06 – 2016-11-07 (×3): 500 mg via ORAL
  Filled 2016-11-06 (×3): qty 1

## 2016-11-06 NOTE — Progress Notes (Signed)
[facility] Date of Admission:  11/02/2016     ID: Marie Mullen is a 76 y.o. female with recurrent UTI Active Problems:   UTI (urinary tract infection)   Subjective: No fevers, no new co. For dc today   ROS  Unable to obtain   Medications:  Antibiotics Given (last 72 hours)    Date/Time Action Medication Dose Rate   11/03/16 1805 Given   ceFEPIme (MAXIPIME) 1 GM / 50mL IVPB premix 1 g 100 mL/hr   11/03/16 2134 Given   linezolid (ZYVOX) tablet 600 mg 600 mg    11/04/16 0930 Given   linezolid (ZYVOX) tablet 600 mg 600 mg    11/04/16 1716 Given   ceFEPIme (MAXIPIME) 1 GM / 50mL IVPB premix 1 g 100 mL/hr   11/04/16 2200 Given   linezolid (ZYVOX) tablet 600 mg 600 mg    11/05/16 1259 Given   linezolid (ZYVOX) tablet 600 mg 600 mg    11/05/16 1740 Given   ceFEPIme (MAXIPIME) 1 GM / 50mL IVPB premix 1 g 100 mL/hr   11/05/16 2140 Given   linezolid (ZYVOX) tablet 600 mg 600 mg    11/06/16 0802 Given   linezolid (ZYVOX) tablet 600 mg 600 mg    11/06/16 1147 Given   cephALEXin (KEFLEX) capsule 500 mg 500 mg      . cephALEXin  500 mg Oral Q12H  . enoxaparin (LOVENOX) injection  40 mg Subcutaneous Q24H  . levothyroxine  125 mcg Oral QAC breakfast  . multivitamin-lutein  1 capsule Oral BID  . potassium chloride  10 mEq Oral Daily  . cyanocobalamin  500 mcg Oral Daily    Objective: Vital signs in last 24 hours: Temp:  [98 F (36.7 C)-98.7 F (37.1 C)] 98.7 F (37.1 C) (03/20 0925) Pulse Rate:  [105-113] 105 (03/20 0925) Resp:  [18] 18 (03/20 0925) BP: (134-149)/(74-81) 146/81 (03/20 0925) SpO2:  [96 %-97 %] 96 % (03/20 0925) Constitutional: disoriented, lying in bed, curled up. Awakens and interacts but unable to tell me where she is or why here HENT: /AT, PERRLA, no scleral icterus Mouth/Throat: Oropharynx is clear and dry . No oropharyngeal exudate.  Cardiovascular: Normal rate, regular rhythm and normal heart sounds.   Pulmonary/Chest: Effort normal and breath sounds normal. No respiratory distress.  has no wheezes.  Neck = supple, no nuchal rigidity Abdominal: Soft. Bowel sounds are normal.  Mild ttp on exam in LLQ Lymphadenopathy: no cervical adenopathy. No axillary adenopathy Neurological: cannot tell me where she is nor when it is Skin: Skin is warm and dry. No rash noted. No erythema.  Psychiatric: flat affect  Lab Results  Recent Labs  11/06/16 0535  WBC 14.8*  HGB 13.5  HCT 40.6  CREATININE 0.87    Microbiology: Results for orders placed or performed during the hospital encounter of 11/02/16  Blood Culture (routine x 2)     Status: None (Preliminary result)   Collection Time: 11/02/16  3:55 PM  Result Value Ref Range Status   Specimen Description BLOOD RIGHT ASSIST CONTROL  Final   Special Requests BOTTLES DRAWN AEROBIC AND ANAEROBIC BCAV  Final   Culture NO GROWTH 4 DAYS  Final   Report Status PENDING  Incomplete  Blood Culture (routine x 2)     Status: None (Preliminary result)   Collection Time: 11/02/16  3:55 PM  Result Value Ref Range Status   Specimen Description BLOOD LEFT ASSIST CONTROL  Final   Special Requests  BOTTLES DRAWN AEROBIC AND ANAEROBIC BCLV  Final   Culture NO GROWTH 4 DAYS  Final   Report Status PENDING  Incomplete  Urine culture     Status: Abnormal   Collection Time: 11/02/16  4:04 PM  Result Value Ref Range Status   Specimen Description URINE, CATHETERIZED  Final   Special Requests Normal  Final   Culture >=100,000 COLONIES/mL KLEBSIELLA PNEUMONIAE (A)  Final   Report Status 11/05/2016 FINAL  Final   Organism ID, Bacteria KLEBSIELLA PNEUMONIAE (A)  Final      Susceptibility   Klebsiella pneumoniae - MIC*    AMPICILLIN >=32 RESISTANT Resistant     CEFAZOLIN <=4 SENSITIVE Sensitive     CEFTRIAXONE <=1 SENSITIVE Sensitive     CIPROFLOXACIN <=0.25 SENSITIVE Sensitive     GENTAMICIN <=1 SENSITIVE Sensitive     IMIPENEM <=0.25 SENSITIVE Sensitive      NITROFURANTOIN 64 INTERMEDIATE Intermediate     TRIMETH/SULFA <=20 SENSITIVE Sensitive     AMPICILLIN/SULBACTAM 16 INTERMEDIATE Intermediate     PIP/TAZO 16 SENSITIVE Sensitive     Extended ESBL NEGATIVE Sensitive     * >=100,000 COLONIES/mL KLEBSIELLA PNEUMONIAE    Studies/Results: No results found.  Assessment/Plan: Marie Mullen is a 76 y.o. female with dementia, in a SNF, admitted with dysuria and found to have UTI with Klebsiella. She has had several recent UTIs with enterococcus (including VRE) and has a CT showing bladder wall irregularities. She provides very little history and cannot tell me why she is here, or where she is.  Fortunately her current UTI is very sensitive.  No evidence of VRE.  She is at high risk of recurrent infections and will need follow up with urology.   Recommendations Keflex 500 bid for 2 weeks Then start macrobid suppressive therapy (the Klebsiella was intermediate to macrobid, GFR 50) Would make sure she sees urology prior to 2 weeks as she would benefit from cystoscopy as otpt. If no hx gyn or estrogen sensitive cancer would start vaginal estrogen cream daily to uretheral meatus No need for ID follow up unless she develops a MDR UTI or needs IV abx. Thank you very much for the consult. Will follow with you.  [name]   11/06/2016, 3:20 PM

## 2016-11-06 NOTE — NC FL2 (Signed)
[place] MEDICAID FL2 LEVEL OF CARE SCREENING TOOL     IDENTIFICATION  Patient Name: Marie Mullen Birthdate: Dec 14, 1940 Sex: female Admission Date (Current Location): 11/02/2016  [place] [place]   Facility and Address:  [facility], [address], [place], [place] [address]      Provider Number: [id number]  Attending Physician Name and Address:  Altamese DillingVaibhavkumar Vachhani, MD  Relative Name and Phone Number:       Current Level of Care: Hospital Recommended Level of Care: Skilled Nursing Facility Prior Approval Number:    Date Approved/Denied:   PASRR Number:    Discharge Plan: SNF    Current Diagnoses: Patient Active Problem List   Diagnosis Date Noted  . Acute delirium 07/28/2016  . 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 Cerebrovascular disease 07/28/2016  . UTI (urinary tract infection) 07/27/2016  . Encephalopathy 09/15/2015  . Urinary tract infection 09/15/2015  . CKD (chronic kidney disease) stage 2, GFR 60-89 ml/min 07/08/2015  . Hypothyroidism 07/08/2015  . Bladder mass 07/08/2015    Orientation RESPIRATION BLADDER Height & Weight     Self, Time  Normal Incontinent Weight: 150 lb (68 kg) Height:  5\' 8"  (172.7 cm)  BEHAVIORAL SYMPTOMS/MOOD NEUROLOGICAL BOWEL NUTRITION STATUS      Incontinent Diet (Regular Diet)  AMBULATORY STATUS COMMUNICATION OF NEEDS Skin   Extensive Assist Verbally Normal                       Personal Care Assistance Level of Assistance  Bathing, Feeding, Dressing Bathing Assistance: Maximum assistance Feeding assistance: Limited assistance Dressing Assistance: Maximum assistance     Functional Limitations Info  Sight, Hearing, Speech Sight Info: Adequate Hearing Info: Adequate Speech Info: Adequate    SPECIAL CARE FACTORS FREQUENCY  PT (By licensed PT)     PT Frequency: 5               Contractures Contractures Info: Not present    Additional Factors Info  Code Status, Allergies, Isolation Precautions Code Status Info: Full Code Allergies Info: Ciprofloxacin     Isolation Precautions Info: Contact Iso:  VRE     Current Medications (11/06/2016):  This is the current hospital active medication list Current Facility-Administered Medications  Medication Dose Route Frequency Provider Last Rate Last Dose  . acetaminophen (TYLENOL) tablet 650 mg  650 mg Oral Q6H PRN Houston SirenVivek J Sainani, MD   650 mg at 11/03/16 [id number]   Or  . acetaminophen (TYLENOL) suppository 650 mg  650 mg Rectal Q6H PRN Houston SirenVivek J Sainani, MD      . cephALEXin (KEFLEX) capsule 500 mg  500 mg Oral Q12H Altamese DillingVaibhavkumar Vachhani, MD   500 mg at 11/06/16 1147  . diphenhydrAMINE (BENADRYL) capsule 25 mg  25 mg Oral Q8H PRN Houston SirenVivek J Sainani, MD   25 mg at 11/02/16 2016  . enoxaparin (LOVENOX) injection 40 mg  40 mg Subcutaneous Q24H Houston SirenVivek J Sainani, MD   40 mg at 11/05/16 2140  . levothyroxine (SYNTHROID, LEVOTHROID) tablet 125 mcg  125 mcg Oral QAC breakfast Houston SirenVivek J Sainani, MD   125 mcg at 11/06/16 0750  . multivitamin-lutein (OCUVITE-LUTEIN) capsule 1 capsule  1 capsule Oral BID Houston SirenVivek J Sainani, MD   1 capsule at 11/06/16 0802  . ondansetron (ZOFRAN) tablet 4 mg  4 mg Oral Q6H PRN Houston SirenVivek J Sainani, MD       Or  .  ondansetron (ZOFRAN) injection 4 mg  4 mg Intravenous Q6H PRN Houston SirenVivek J Sainani, MD      . potassium chloride (K-DUR,KLOR-CON) CR tablet 10 mEq  10 mEq Oral Daily Houston SirenVivek J Sainani, MD   10 mEq at 11/06/16 0802  . vitamin B-12 (CYANOCOBALAMIN) tablet 500 mcg  500 mcg Oral Daily Houston SirenVivek J Sainani, MD   500 mcg at 11/06/16 0802     Discharge Medications: Please see discharge summary for a list of discharge medications.  Relevant Imaging Results:  Relevant Lab Results:   Additional Information SSN:  [id number]  Dede QuerySarah Donni Oglesby, LCSW

## 2016-11-06 NOTE — Progress Notes (Addendum)
PASARR is pending. Patient can't D/C to [facility] until SNF level PASARR is received. MD and RN aware of above. [facility] liaison is aware of above. Patient's sister Olegario MessierKathy is aware of above.   Baker Hughes IncorporatedBailey Laveah Gloster, LCSW [phone]

## 2016-11-06 NOTE — Discharge Summary (Signed)
[facility] Physicians - [place] at [facility]   PATIENT NAME: Marie Mullen    MR#:  [id number]  DATE OF BIRTH:  1941/05/16  DATE OF ADMISSION:  11/02/2016 ADMITTING PHYSICIAN: Houston Siren, MD  DATE OF DISCHARGE: 11/06/2016  PRIMARY CARE PHYSICIAN: No PCP Per Patient    ADMISSION DIAGNOSIS:  Lower urinary tract infection, acute [N39.0] Failure of outpatient treatment [Z78.9]  DISCHARGE DIAGNOSIS:  Active Problems:   UTI (urinary tract infection)   SECONDARY DIAGNOSIS:   Past Medical History:  Diagnosis Date  . Cardiac arrest (HCC) 1990   . Chronic kidney disease   . Complication of anesthesia    slow to wake up   . Eczema   . Headache   . Hypothyroidism   . Lower extremity edema   . Pneumonia    hx of   . Weakness of both legs    knees  . WPW (Wolff-Parkinson-White syndrome)     HOSPITAL COURSE:   76 year old female with past medical history of WPW, hypothyroidism, history of chronic kidney disease, who presents to the hospital due to ongoing dysuria.  1. Urinary tract infection-patient has a history of a previous VRE UTI. - She was started on Zyvox at the assisted living but continues to have worsening symptoms and noted to have a leukocytosis. Her urinalysis is positive for UTI. Urine culture growing kleb Pneumonia. Sensitivities arrived. -empirically on IV cefepime, oral Zyvox. Appreciated infectious disease consult. -Follow clinically.  on Pyridium - stopped due to Low GFR - ID suggested to give oral keflex for 2 weeks,and then have followup with urology and start on macrobid suppresive therapy.  2. Leukocytosis-secondary to the urinary tract infection. Follow white cell count with IV antibiotic therapy.  3. Hypothyroidism-continue Synthroid.  4. Dementia- baseline.  DISCHARGE CONDITIONS:   stable.  CONSULTS OBTAINED:  Treatment Team:  Enedina Finner, MD Mick Sell, MD  DRUG ALLERGIES:   Allergies  Allergen Reactions   . Ciprofloxacin Rash    Possible reaction, unclear    DISCHARGE MEDICATIONS:   Current Discharge Medication List    START taking these medications   Details  cephALEXin (KEFLEX) 500 MG capsule Take 1 capsule (500 mg total) by mouth every 12 (twelve) hours. Qty: 22 capsule, Refills: 0      CONTINUE these medications which have NOT CHANGED   Details  diphenhydrAMINE (BENADRYL) 25 mg capsule Take 1 capsule (25 mg total) by mouth every 8 (eight) hours as needed for itching. Qty: 10 capsule, Refills: 0    hydrocortisone valerate ointment (WESTCORT) 0.2 % Apply to affected area daily Qty: 45 g, Refills: 1    levothyroxine (SYNTHROID, LEVOTHROID) 125 MCG tablet Take 1 tablet (125 mcg total) by mouth daily before breakfast. Qty: 30 tablet, Refills: 0    Multiple Vitamins-Minerals (PRESERVISION AREDS 2+MULTI VIT PO) Take 1 capsule by mouth 2 (two) times daily.    potassium chloride (K-DUR) 10 MEQ tablet Take 1 tablet (10 mEq total) by mouth daily. Qty: 30 tablet, Refills: 0    vitamin B-12 500 MCG tablet Take 1 tablet (500 mcg total) by mouth daily. Qty: 30 tablet, Refills: 0    haloperidol (HALDOL) 0.5 MG tablet Take 1 tablet (0.5 mg total) by mouth 4 (four) times daily. Qty: 120 tablet, Refills: 0      STOP taking these medications     linezolid (ZYVOX) 600 MG tablet          DISCHARGE INSTRUCTIONS:    Follow with Urology  clinic in 2 weeks.  If you experience worsening of your admission symptoms, develop shortness of breath, life threatening emergency, suicidal or homicidal thoughts you must seek medical attention immediately by calling 911 or calling your MD immediately  if symptoms less severe.  You Must read complete instructions/literature along with all the possible adverse reactions/side effects for all the Medicines you take and that have been prescribed to you. Take any new Medicines after you have completely understood and accept all the possible adverse  reactions/side effects.   Please note  You were cared for by a hospitalist during your hospital stay. If you have any questions about your discharge medications or the care you received while you were in the hospital after you are discharged, you can call the unit and asked to speak with the hospitalist on call if the hospitalist that took care of you is not available. Once you are discharged, your primary care physician will handle any further medical issues. Please note that NO REFILLS for any discharge medications will be authorized once you are discharged, as it is imperative that you return to your primary care physician (or establish a relationship with a primary care physician if you do not have one) for your aftercare needs so that they can reassess your need for medications and monitor your lab values.    Today   CHIEF COMPLAINT:   Chief Complaint  Patient presents with  . Dysuria    HISTORY OF PRESENT ILLNESS:  Marie Mullen  is a 76 y.o. female with a known history of Hypertension, chronic kidney disease, hypothyroidism, history of WPW, history of previous VRE UTI, who presents to the hospital due to ongoing dysuria. Patient says she's been having worsening dysuria for the past few days progressively getting worse. She was recently diagnosed with a VRE UTI a few months back and treated with Zyvox. She was started on oral Zyvox to 3 days back but despite taking the antibiotics she continues to have worsening dysuria. She therefore came to the ER for further evaluation. Patient denies any nausea vomiting fevers or chills abdominal pain chest pain shortness of breath or any other associated symptoms presently.   VITAL SIGNS:  Blood pressure (!) 149/75, pulse (!) 111, temperature 98.1 F (36.7 C), temperature source Oral, resp. rate 18, height 5\' 8"  (1.727 m), weight 68 kg (150 lb), SpO2 96 %.  I/O:   Intake/Output Summary (Last 24 hours) at 11/06/16 0855 Last data filed at 11/06/16  0616  Gross per 24 hour  Intake              410 ml  Output                0 ml  Net              410 ml    PHYSICAL EXAMINATION:   GENERAL:  76 y.o.-year-old patient lying in the bed with no acute distress. Thin EYES: Pupils equal, round, reactive to light and accommodation. No scleral icterus. Extraocular muscles intact.  HEENT: Head atraumatic, normocephalic. Oropharynx and nasopharynx clear.  NECK:  Supple, no jugular venous distention. No thyroid enlargement, no tenderness.  LUNGS: Normal breath sounds bilaterally, no wheezing, rales, rhonchi. No use of accessory muscles of respiration.  CARDIOVASCULAR: S1, S2 normal. No murmurs, rubs, or gallops.  ABDOMEN: Soft, nontender, nondistended. Bowel sounds present. No organomegaly or mass.  EXTREMITIES: No cyanosis, clubbing or edema b/l.    NEUROLOGIC: Cranial nerves II through XII are  intact. No focal Motor or sensory deficits b/l.   PSYCHIATRIC:  patient is alert and not well oriented.  SKIN: No obvious rash, lesion, or ulcer.   DATA REVIEW:   CBC  Recent Labs Lab 11/06/16 0535  WBC 14.8*  HGB 13.5  HCT 40.6  PLT 407    Chemistries   Recent Labs Lab 11/03/16 0520 11/06/16 0535  NA 140  --   K 4.0  --   CL 107  --   CO2 25  --   GLUCOSE 101*  --   BUN 26*  --   CREATININE 0.94 0.87  CALCIUM 8.8*  --     Cardiac Enzymes No results for input(s): TROPONINI in the last 168 hours.  Microbiology Results  Results for orders placed or performed during the hospital encounter of 11/02/16  Blood Culture (routine x 2)     Status: None (Preliminary result)   Collection Time: 11/02/16  3:55 PM  Result Value Ref Range Status   Specimen Description BLOOD RIGHT ASSIST CONTROL  Final   Special Requests BOTTLES DRAWN AEROBIC AND ANAEROBIC BCAV  Final   Culture NO GROWTH 4 DAYS  Final   Report Status PENDING  Incomplete  Blood Culture (routine x 2)     Status: None (Preliminary result)   Collection Time: 11/02/16  3:55 PM   Result Value Ref Range Status   Specimen Description BLOOD LEFT ASSIST CONTROL  Final   Special Requests BOTTLES DRAWN AEROBIC AND ANAEROBIC BCLV  Final   Culture NO GROWTH 4 DAYS  Final   Report Status PENDING  Incomplete  Urine culture     Status: Abnormal   Collection Time: 11/02/16  4:04 PM  Result Value Ref Range Status   Specimen Description URINE, CATHETERIZED  Final   Special Requests Normal  Final   Culture >=100,000 COLONIES/mL KLEBSIELLA PNEUMONIAE (A)  Final   Report Status 11/05/2016 FINAL  Final   Organism ID, Bacteria KLEBSIELLA PNEUMONIAE (A)  Final      Susceptibility   Klebsiella pneumoniae - MIC*    AMPICILLIN >=32 RESISTANT Resistant     CEFAZOLIN <=4 SENSITIVE Sensitive     CEFTRIAXONE <=1 SENSITIVE Sensitive     CIPROFLOXACIN <=0.25 SENSITIVE Sensitive     GENTAMICIN <=1 SENSITIVE Sensitive     IMIPENEM <=0.25 SENSITIVE Sensitive     NITROFURANTOIN 64 INTERMEDIATE Intermediate     TRIMETH/SULFA <=20 SENSITIVE Sensitive     AMPICILLIN/SULBACTAM 16 INTERMEDIATE Intermediate     PIP/TAZO 16 SENSITIVE Sensitive     Extended ESBL NEGATIVE Sensitive     * >=100,000 COLONIES/mL KLEBSIELLA PNEUMONIAE    RADIOLOGY:  No results found.  EKG:   Orders placed or performed during the hospital encounter of 09/19/16  . ED EKG within 10 minutes  . ED EKG within 10 minutes  . EKG 12-Lead  . EKG 12-Lead      Management plans discussed with the patient, family and they are in agreement.  CODE STATUS:     Code Status Orders        Start     Ordered   11/02/16 1922  Full code  Continuous     11/02/16 1921    Code Status History    Date Active Date Inactive Code Status Order ID Comments User Context   07/27/2016 10:52 AM 07/31/2016  3:48 PM Full Code [id number]  Auburn Bilberry, MD Inpatient   09/15/2015  9:07 PM 09/20/2015 12:16 AM Full Code [id number]  Cletis Athens  Hower, MD ED   07/06/2015  5:32 PM 07/09/2015  6:20 PM Full Code [id number]  Ramonita Lab, MD Inpatient       TOTAL TIME TAKING CARE OF THIS PATIENT:35 minutes.    Altamese Dilling M.D on 11/06/2016 at 8:55 AM  Between 7am to 6pm - Pager - [phone]  After 6pm go to [email or web address] - Social research officer, government  [facility] [place] [facility]  Office  [phone]  CC: Primary care physician; No PCP Per Patient   Note: This dictation was prepared with Dragon dictation along with smaller phrase technology. Any transcriptional errors that result from this process are unintentional.

## 2016-11-06 NOTE — Progress Notes (Signed)
  Physical Therapy Treatment Patient Details Name: Marie Mullen MRN: [id number] DOB: [birth year] Today's Date: 11/06/2016    History of Present Illness 76 yo female with onset of UTI with leukocytosis, PMHx:  Phillips OdorWolff Parkinson White syndrome, cardiac arrest, CKD, HA, LE edema, PNA, UTI, hypothyroid,     PT Comments    Pt was able to tolerate supine LE exercises well today.  Verbal cues to continue with movements but overall does well.  Agrees to OOB initially.  Initiated LE's to edge of bed but then c/o side discomfort and returns LE's to bed.  Encouraged participation with assist but she declined further.  Pt stated frequently that she was hot.  Temperature adjusted and blankets removed from bed with sheet left to cover.    Follow Up Recommendations  SNF     Equipment Recommendations       Recommendations for Other Services       Precautions / Restrictions Precautions Precautions: Fall Restrictions Weight Bearing Restrictions: No    Mobility  Bed Mobility Overal bed mobility: Needs Assistance             General bed mobility comments: pt with good ability to move her LE's today for exercises and initiating EOB but stops halfway and declined further.  Transfers                    Ambulation/Gait                 Stairs            Wheelchair Mobility    Modified Rankin (Stroke Patients Only)       Balance                                    Cognition Arousal/Alertness: Awake/alert Behavior During Therapy: WFL for tasks assessed/performed Overall Cognitive Status: Within Functional Limits for tasks assessed                      Exercises Other Exercises Other Exercises: supine AROM BLE's for heel slides, SLR, ab/add and ankle pumps.  x 10 with out c/o pain or difficulty    General Comments        Pertinent Vitals/Pain Pain Assessment: 0-10 Pain Score: 3  Pain Location: sides with attempt to get to edge of  bed Pain Descriptors / Indicators: Sore    Home Living                      Prior Function            PT Goals (current goals can now be found in the care plan section) Progress towards PT goals: Progressing toward goals    Frequency    Min 2X/week      PT Plan      Co-evaluation             End of Session   Activity Tolerance: Other (comment) Patient left: in bed;with call bell/phone within reach;with bed alarm set         Time: 1009-1018 PT Time Calculation (min) (ACUTE ONLY): 9 min  Charges:  $Therapeutic Exercise: 8-22 mins                    G Codes:       Danielle DessSarah Lakeasha Petion, PTA 11/06/16, 10:48 AM

## 2016-11-06 NOTE — [place] FL2 (Signed)
[place] MEDICAID FL2 LEVEL OF CARE SCREENING TOOL     IDENTIFICATION  Patient Name: Marie Mullen Birthdate: Dec 14, 1940 Sex: female Admission Date (Current Location): 11/02/2016  [place]   Facility and Address:  [facility], [address], [place], [place] [address]      Provider Number: [id number]  Attending Physician Name and Address:  Altamese DillingVaibhavkumar Vachhani, MD  Relative Name and Phone Number:       Current Level of Care: Hospital Recommended Level of Care: Skilled Nursing Facility Prior Approval Number:    Date Approved/Denied:   PASRR Number:    Discharge Plan: SNF    Current Diagnoses: Patient Active Problem List   Diagnosis Date Noted  . Acute delirium 07/28/2016  . 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 Cerebrovascular disease 07/28/2016  . UTI (urinary tract infection) 07/27/2016  . Encephalopathy 09/15/2015  . Urinary tract infection 09/15/2015  . CKD (chronic kidney disease) stage 2, GFR 60-89 ml/min 07/08/2015  . Hypothyroidism 07/08/2015  . Bladder mass 07/08/2015    Orientation RESPIRATION BLADDER Height & Weight     Self, Time  Normal Incontinent Weight: 150 lb (68 kg) Height:  5\' 8"  (172.7 cm)  BEHAVIORAL SYMPTOMS/MOOD NEUROLOGICAL BOWEL NUTRITION STATUS      Incontinent Diet (Regular Diet)  AMBULATORY STATUS COMMUNICATION OF NEEDS Skin   Extensive Assist Verbally Normal                       Personal Care Assistance Level of Assistance  Bathing, Feeding, Dressing Bathing Assistance: Maximum assistance Feeding assistance: Limited assistance Dressing Assistance: Maximum assistance     Functional Limitations Info  Sight, Hearing, Speech Sight Info: Adequate Hearing Info: Adequate Speech Info: Adequate    SPECIAL CARE FACTORS FREQUENCY  PT (By licensed PT)     PT Frequency: 5               Contractures Contractures Info: Not present    Additional Factors Info  Code Status, Allergies, Isolation Precautions Code Status Info: Full Code Allergies Info: Ciprofloxacin     Isolation Precautions Info: Contact Iso:  VRE     Current Medications (11/06/2016):  This is the current hospital active medication list Current Facility-Administered Medications  Medication Dose Route Frequency Provider Last Rate Last Dose  . acetaminophen (TYLENOL) tablet 650 mg  650 mg Oral Q6H PRN Houston SirenVivek J Sainani, MD   650 mg at 11/03/16 [id number]   Or  . acetaminophen (TYLENOL) suppository 650 mg  650 mg Rectal Q6H PRN Houston SirenVivek J Sainani, MD      . cephALEXin (KEFLEX) capsule 500 mg  500 mg Oral Q12H Altamese DillingVaibhavkumar Vachhani, MD   500 mg at 11/06/16 1147  . diphenhydrAMINE (BENADRYL) capsule 25 mg  25 mg Oral Q8H PRN Houston SirenVivek J Sainani, MD   25 mg at 11/02/16 2016  . enoxaparin (LOVENOX) injection 40 mg  40 mg Subcutaneous Q24H Houston SirenVivek J Sainani, MD   40 mg at 11/05/16 2140  . levothyroxine (SYNTHROID, LEVOTHROID) tablet 125 mcg  125 mcg Oral QAC breakfast Houston SirenVivek J Sainani, MD   125 mcg at 11/06/16 0750  . multivitamin-lutein (OCUVITE-LUTEIN) capsule 1 capsule  1 capsule Oral BID Houston SirenVivek J Sainani, MD   1 capsule at 11/06/16 0802  . ondansetron (ZOFRAN) tablet 4 mg  4 mg Oral Q6H PRN Houston SirenVivek J Sainani, MD       Or  .  ondansetron (ZOFRAN) injection 4 mg  4 mg Intravenous Q6H PRN Houston Siren, MD      . potassium chloride (K-DUR,KLOR-CON) CR tablet 10 mEq  10 mEq Oral Daily Houston Siren, MD   10 mEq at 11/06/16 0802  . vitamin B-12 (CYANOCOBALAMIN) tablet 500 mcg  500 mcg Oral Daily Houston Siren, MD   500 mcg at 11/06/16 0802     Discharge Medications: Please see discharge summary for a list of discharge medications.  Relevant Imaging Results:  Relevant Lab Results:   Additional Information SSN:  [id number]  Sample, Darleen Crocker, LCSW

## 2016-11-07 LAB — CULTURE, BLOOD (ROUTINE X 2)
CULTURE: NO GROWTH
Culture: NO GROWTH

## 2016-11-07 MED ORDER — NITROFURANTOIN MONOHYD MACRO 100 MG PO CAPS: 100.0000 mg | ORAL_CAPSULE | Freq: Every day | ORAL | Status: DC

## 2016-11-07 MED ORDER — NITROFURANTOIN MONOHYD MACRO 100 MG PO CAPS: 100.0000 mg | ORAL_CAPSULE | Freq: Every day | ORAL | 2 refills | Status: AC

## 2016-11-07 NOTE — Discharge Instructions (Signed)
Follow with Urology clinic on 11/26/16

## 2016-11-07 NOTE — Clinical Social Work Placement (Signed)
   CLINICAL SOCIAL WORK PLACEMENT  NOTE  Date:  11/07/2016  Patient Details  Name: Marie Mullen MRN: [id number] Date of Birth: 02/24/1941  Clinical Social Work is seeking post-discharge placement for this patient at the Skilled  Nursing Facility level of care (*CSW will initial, date and re-position this form in  chart as items are completed):  Yes   Patient/family provided with [facility] Clinical Social Work Department's list of facilities offering this level of care within the geographic area requested by the patient (or if unable, by the patient's family).  Yes   Patient/family informed of their freedom to choose among providers that offer the needed level of care, that participate in Medicare, Medicaid or managed care program needed by the patient, have an available bed and are willing to accept the patient.  Yes   Patient/family informed of 's ownership interest in [facility] and [facility], as well as of the fact that they are under no obligation to receive care at these facilities.  PASRR submitted to EDS on 11/06/16     PASRR number received on 11/07/16     Existing PASRR number confirmed on       FL2 transmitted to all facilities in geographic area requested by pt/family on 11/06/16     FL2 transmitted to all facilities within larger geographic area on       Patient informed that his/her managed care company has contracts with or will negotiate with certain facilities, including the following:        Yes   Patient/family informed of bed offers received.  [facility]     Physician recommends and patient chooses bed at      Patient to be transferred to [facility] [place] on 11/07/16.  [facility]     Patient family notified on 11/07/16 of transfer.  Name of family member notified:  Pt's sister, Olegario MessierKathy     PHYSICIAN       Additional Comment:     _______________________________________________ Dede QuerySarah Lavalle Skoda, LCSW 11/07/2016, 11:04 AM

## 2016-11-07 NOTE — Progress Notes (Signed)
Called report to Konrad DoloresKim Hicks RN  [facility]

## 2016-11-07 NOTE — Discharge Summary (Signed)
[facility]   PATIENT NAME: Marie Mullen    MR#:  [id number]  DATE OF BIRTH:  30-Jan-1941  DATE OF ADMISSION:  11/02/2016 ADMITTING PHYSICIAN: Houston Siren, MD  DATE OF DISCHARGE: 11/07/16  PRIMARY CARE PHYSICIAN: No PCP Per Patient    ADMISSION DIAGNOSIS:  Lower urinary tract infection, acute [N39.0] Failure of outpatient treatment [Z78.9]  DISCHARGE DIAGNOSIS:  Recurrent UTI  SECONDARY DIAGNOSIS:   Past Medical History:  Diagnosis Date  . Cardiac arrest (HCC) 1990   . Chronic kidney disease   . Complication of anesthesia    slow to wake up   . Eczema   . Headache   . Hypothyroidism   . Lower extremity edema   . Pneumonia    hx of   . Weakness of both legs    knees  . WPW (Wolff-Parkinson-White syndrome)     HOSPITAL COURSE:   76 year old female with past medical history of WPW, hypothyroidism, history of chronic kidney disease, who presents to the hospital due to ongoing dysuria.  1. Urinary tract infection-patient has a history of a previous VRE UTI. - She was started on Zyvox at the assisted living but continues to have worsening symptoms and noted to have a leukocytosis. Her urinalysis is positive for UTI. Urine culture growing kleb Pneumonia. Sensitivities arrived. -empirically on IV cefepime---chang eto oral abxs. Appreciatedinfectious disease consult. - ID suggested to give oral keflex for 2 weeks,and then have followup with urology and start on macrobid suppresive therapy.  2. Leukocytosis-secondary to the urinary tract infection.   3. Hypothyroidism-continue Synthroid.  4. Dementia- baseline.  D/c to rehab today CONSULTS OBTAINED:  Treatment Team:  Enedina Finner, MD Mick Sell, MD  DRUG ALLERGIES:   Allergies  Allergen Reactions  . Ciprofloxacin Rash    Possible reaction, unclear    DISCHARGE MEDICATIONS:   Current Discharge Medication List    START taking these medications    Details  cephALEXin (KEFLEX) 500 MG capsule Take 1 capsule (500 mg total) by mouth every 12 (twelve) hours. Qty: 22 capsule, Refills: 0    nitrofurantoin, macrocrystal-monohydrate, (MACROBID) 100 MG capsule Take 1 capsule (100 mg total) by mouth at bedtime. For chronic suppressive rx for UTI Qty: 30 capsule, Refills: 2      CONTINUE these medications which have NOT CHANGED   Details  diphenhydrAMINE (BENADRYL) 25 mg capsule Take 1 capsule (25 mg total) by mouth every 8 (eight) hours as needed for itching. Qty: 10 capsule, Refills: 0    hydrocortisone valerate ointment (WESTCORT) 0.2 % Apply to affected area daily Qty: 45 g, Refills: 1    levothyroxine (SYNTHROID, LEVOTHROID) 125 MCG tablet Take 1 tablet (125 mcg total) by mouth daily before breakfast. Qty: 30 tablet, Refills: 0    Multiple Vitamins-Minerals (PRESERVISION AREDS 2+MULTI VIT PO) Take 1 capsule by mouth 2 (two) times daily.    potassium chloride (K-DUR) 10 MEQ tablet Take 1 tablet (10 mEq total) by mouth daily. Qty: 30 tablet, Refills: 0    vitamin B-12 500 MCG tablet Take 1 tablet (500 mcg total) by mouth daily. Qty: 30 tablet, Refills: 0    haloperidol (HALDOL) 0.5 MG tablet Take 1 tablet (0.5 mg total) by mouth 4 (four) times daily. Qty: 120 tablet, Refills: 0      STOP taking these medications     linezolid (ZYVOX) 600 MG tablet         If you experience worsening of your  admission symptoms, develop shortness of breath, life threatening emergency, suicidal or homicidal thoughts you must seek medical attention immediately by calling 911 or calling your MD immediately  if symptoms less severe.  You Must read complete instructions/literature along with all the possible adverse reactions/side effects for all the Medicines you take and that have been prescribed to you. Take any new Medicines after you have completely understood and accept all the possible adverse reactions/side effects.   Please note  You  were cared for by a hospitalist during your hospital stay. If you have any questions about your discharge medications or the care you received while you were in the hospital after you are discharged, you can call the unit and asked to speak with the hospitalist on call if the hospitalist that took care of you is not available. Once you are discharged, your primary care physician will handle any further medical issues. Please note that NO REFILLS for any discharge medications will be authorized once you are discharged, as it is imperative that you return to your primary care physician (or establish a relationship with a primary care physician if you do not have one) for your aftercare needs so that they can reassess your need for medications and monitor your lab values. Today   SUBJECTIVE   No new complaints  VITAL SIGNS:  Blood pressure (!) 141/76, pulse (!) 103, temperature 97.5 F (36.4 C), temperature source Oral, resp. rate 16, height 5\' 8"  (1.727 m), weight 68 kg (150 lb), SpO2 100 %.  I/O:  No intake or output data in the 24 hours ending 11/07/16 0957  PHYSICAL EXAMINATION:  GENERAL:  76 y.o.-year-old patient lying in the bed with no acute distress.  EYES: Pupils equal, round, reactive to light and accommodation. No scleral icterus. Extraocular muscles intact.  HEENT: Head atraumatic, normocephalic. Oropharynx and nasopharynx clear.  NECK:  Supple, no jugular venous distention. No thyroid enlargement, no tenderness.  LUNGS: Normal breath sounds bilaterally, no wheezing, rales,rhonchi or crepitation. No use of accessory muscles of respiration.  CARDIOVASCULAR: S1, S2 normal. No murmurs, rubs, or gallops.  ABDOMEN: Soft, non-tender, non-distended. Bowel sounds present. No organomegaly or mass.  EXTREMITIES: No pedal edema, cyanosis, or clubbing.  NEUROLOGIC: Cranial nerves II through XII are intact. Muscle strength 5/5 in all extremities. Sensation intact. Gait not checked.  PSYCHIATRIC:  The patient is alert and oriented x 3.  SKIN: No obvious rash, lesion, or ulcer.   DATA REVIEW:   CBC   Recent Labs Lab 11/06/16 0535  WBC 14.8*  HGB 13.5  HCT 40.6  PLT 407    Chemistries   Recent Labs Lab 11/03/16 0520 11/06/16 0535  NA 140  --   K 4.0  --   CL 107  --   CO2 25  --   GLUCOSE 101*  --   BUN 26*  --   CREATININE 0.94 0.87  CALCIUM 8.8*  --     Microbiology Results   Recent Results (from the past 240 hour(s))  Blood Culture (routine x 2)     Status: None   Collection Time: 11/02/16  3:55 PM  Result Value Ref Range Status   Specimen Description BLOOD RIGHT ASSIST CONTROL  Final   Special Requests BOTTLES DRAWN AEROBIC AND ANAEROBIC BCAV  Final   Culture NO GROWTH 5 DAYS  Final   Report Status 11/07/2016 FINAL  Final  Blood Culture (routine x 2)     Status: None   Collection Time: 11/02/16  3:55 PM  Result Value Ref Range Status   Specimen Description BLOOD LEFT ASSIST CONTROL  Final   Special Requests BOTTLES DRAWN AEROBIC AND ANAEROBIC BCLV  Final   Culture NO GROWTH 5 DAYS  Final   Report Status 11/07/2016 FINAL  Final  Urine culture     Status: Abnormal   Collection Time: 11/02/16  4:04 PM  Result Value Ref Range Status   Specimen Description URINE, CATHETERIZED  Final   Special Requests Normal  Final   Culture >=100,000 COLONIES/mL KLEBSIELLA PNEUMONIAE (A)  Final   Report Status 11/05/2016 FINAL  Final   Organism ID, Bacteria KLEBSIELLA PNEUMONIAE (A)  Final      Susceptibility   Klebsiella pneumoniae - MIC*    AMPICILLIN >=32 RESISTANT Resistant     CEFAZOLIN <=4 SENSITIVE Sensitive     CEFTRIAXONE <=1 SENSITIVE Sensitive     CIPROFLOXACIN <=0.25 SENSITIVE Sensitive     GENTAMICIN <=1 SENSITIVE Sensitive     IMIPENEM <=0.25 SENSITIVE Sensitive     NITROFURANTOIN 64 INTERMEDIATE Intermediate     TRIMETH/SULFA <=20 SENSITIVE Sensitive     AMPICILLIN/SULBACTAM 16 INTERMEDIATE Intermediate     PIP/TAZO 16 SENSITIVE Sensitive      Extended ESBL NEGATIVE Sensitive     * >=100,000 COLONIES/mL KLEBSIELLA PNEUMONIAE    RADIOLOGY:  No results found.   Management plans discussed with the patient, family and they are in agreement.  CODE STATUS:     Code Status Orders        Start     Ordered   11/02/16 1922  Full code  Continuous     11/02/16 1921    Code Status History    Date Active Date Inactive Code Status Order ID Comments User Context   07/27/2016 10:52 AM 07/31/2016  3:48 PM Full Code [id number]  Auburn Bilberry, MD Inpatient   09/15/2015  9:07 PM 09/20/2015 12:16 AM Full Code [id number]  Wyatt Haste, MD ED   07/06/2015  5:32 PM 07/09/2015  6:20 PM Full Code [id number]  Ramonita Lab, MD Inpatient      TOTAL TIME TAKING CARE OF THIS PATIENT: 40 minutes.    Jobin Montelongo M.D on 11/07/2016 at 9:57 AM  Between 7am to 6pm - Pager - [phone] After 6pm go to [email or web address] - Social research officer, government  [facility]  Office  [phone]  CC: Primary care physician; No PCP Per Patient

## 2016-11-07 NOTE — Clinical Social Work Note (Signed)
Clinical Social Work Assessment  Patient Details  Name: Marie Mullen MRN: [id number] Date of Birth: Nov 11, 1940  Date of referral:  11/07/16               Reason for consult:  Facility Placement, Discharge Planning                Permission sought to share information with:  Family Supports Permission granted to share information::  Yes, Verbal Permission Granted  Name::     Gaetano NetKathy Sharpe  Relationship::  sister  Contact Information:  [phone]  Housing/Transportation Living arrangements for the past 2 months:  Assisted Living Facility Source of Information:  Other (Comment Required) (Family) Patient Interpreter Needed:  None Criminal Activity/Legal Involvement Pertinent to Current Situation/Hospitalization:  No - Comment as needed Significant Relationships:  Siblings Lives with:  Self Do you feel safe going back to the place where you live?  Yes Need for family participation in patient care:  Yes (Comment)  Care giving concerns:  Pt is in need of a higher level of care.   Social Worker assessment / plan:  CSW spoke to pt's sister to address consult. CSW introduced herself and explained role of social work. [facility] [place] [facility]. However, pt's sister, Olegario MessierKathy, will likely be appointed. Pt is not oriented enough to make decisions, therefore CSW spoke with sister. Pt was admitted from [facility], however PT is recommending SNF for STR. Pt's sister is agreeable. CSW intiaitied SNF search and provided bed offers. Pt's sister chose [facility]. [place] pending. Pt will likely discharge tomorrow. CSW will continue to follow.   Employment status:  Retired Database administratornsurance information:  Managed Medicare PT Recommendations:  Skilled Nursing Facility Information / Referral to community resources:  Skilled Nursing Facility  Patient/Family's Response to care:  Pt's sister in agreement with discharge plan.   Patient/Family's Understanding of and Emotional Response  to Diagnosis, Current Treatment, and Prognosis:  Pt's sister understands that pt needs a higher level of care.   Emotional Assessment Appearance:  Other (Comment Required Attitude/Demeanor/Rapport:  Other Affect (typically observed):  Other Orientation:  Oriented to Self, Oriented to Place Alcohol / Substance use:  Not Applicable Psych involvement (Current and /or in the community):  No (Comment)  Discharge Needs  Concerns to be addressed:  Adjustment to Illness Readmission within the last 30 days:  Yes Current discharge risk:  Chronically ill Barriers to Discharge:  Awaiting State Approval ([place])   Dede QuerySarah Lian Tanori, LCSW 11/07/2016, 3:04 PM

## 2016-11-07 NOTE — Progress Notes (Signed)
Called EMs for pickup and transport to [facility]

## 2016-11-07 NOTE — Clinical Social Work Note (Signed)
Pt is ready for discharge today and will go to [facility]. Pt's sister is aware and agreeable to discharge plan. Facility is ready to accept pt as they received discharge information. RN will call report. [facility]. CSW will update [facility] of transfer. CSW is signing off as no further needs identified.   Dede QuerySarah Neshia Mckenzie, MSW, LCSW  Clinical Social Worker  [phone]

## 2016-11-18 DEATH — deceased

## 2016-11-26 ENCOUNTER — Ambulatory Visit: Payer: Self-pay | Admitting: Urology

## 2017-12-13 IMAGING — MR MR HEAD W/O CM
10 series · 48 of 48 positions shown · non-contrast
Comparison: CT head 07/06/2015

CLINICAL DATA: Altered mental status

EXAM:
MRI HEAD WITHOUT CONTRAST
TECHNIQUE: Multiplanar, multiecho pulse sequences of the brain and surrounding
structures were obtained without intravenous contrast.

[Series 2: T1 · sagittal · 5.0mm · 0.45mm/px · 3 of 29 slices shown (1 of 2)]
[im 1/29]
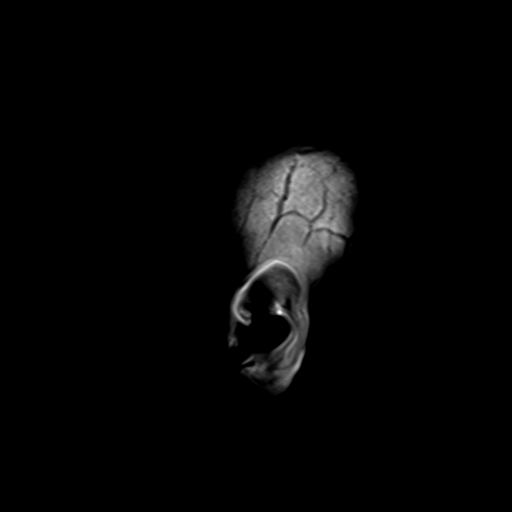
[im 15/29]
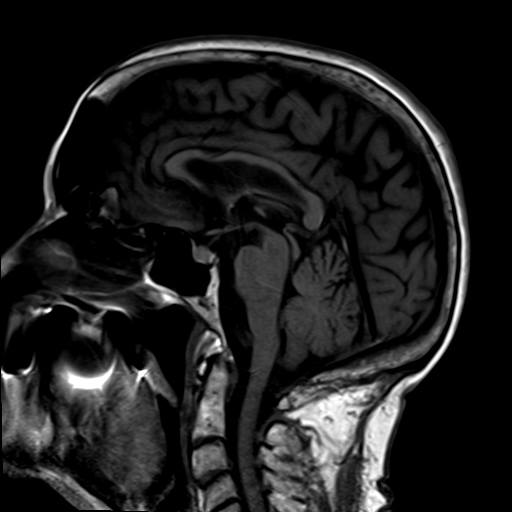
[im 29/29]
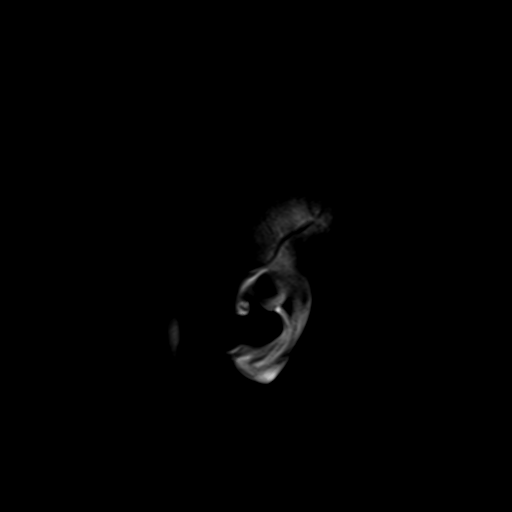

[Series 4: DWI · axial · 3.0mm · 1.80mm/px · z∈[-54,+103]mm · 7 of 55 slices shown (1 of 4)]
[im 1/55]
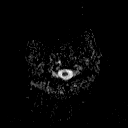
[im 10/55]
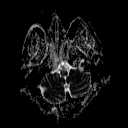
[im 19/55]
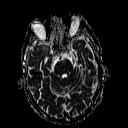
[im 28/55]
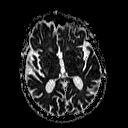
[im 37/55]
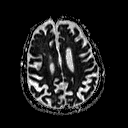
[im 46/55]
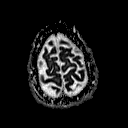
[im 55/55]
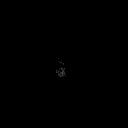

[Series 6: DWI · coronal · 3.0mm · 1.80mm/px · 6 of 49 slices shown (2 of 4)]
[im 1/49]
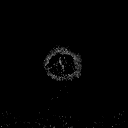
[im 10/49]
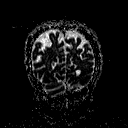
[im 20/49]
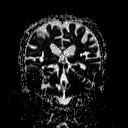
[im 29/49]
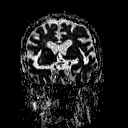
[im 39/49]
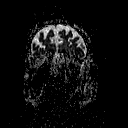
[im 49/49]
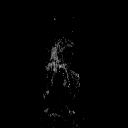

[Series 7: T2 · axial · 5.0mm · 0.60mm/px · z∈[-62,+102]mm · 3 of 26 slices shown (1 of 3)]
[im 1/26]
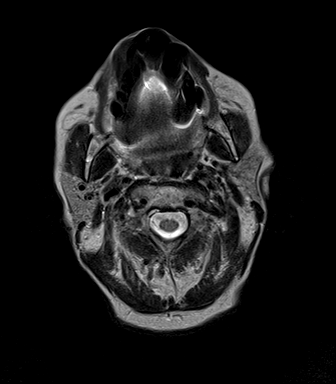
[im 13/26]
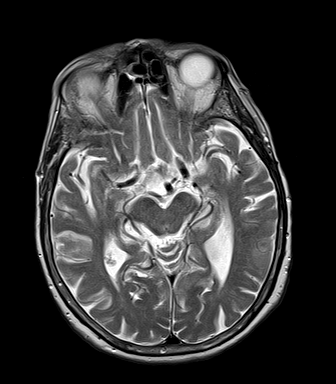
[im 26/26]
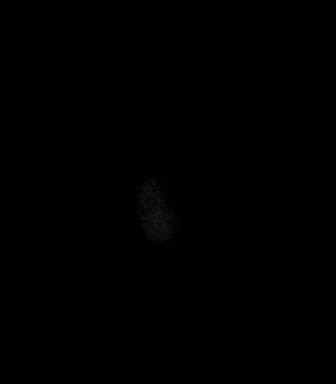

[Series 8: FLAIR · axial · 5.0mm · 0.45mm/px · z∈[-62,+102]mm · 3 of 27 slices shown]
[im 1/27]
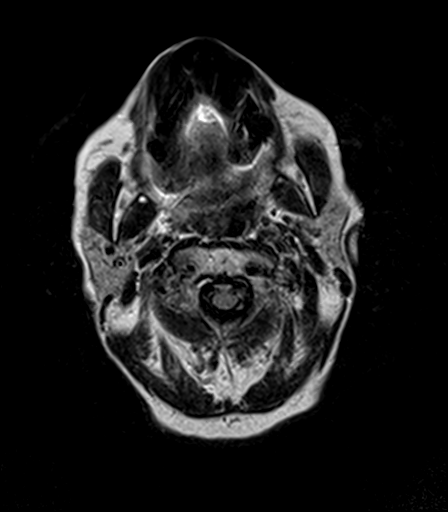
[im 14/27]
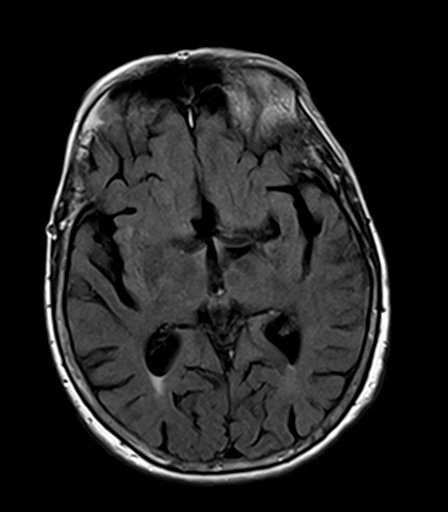
[im 27/27]
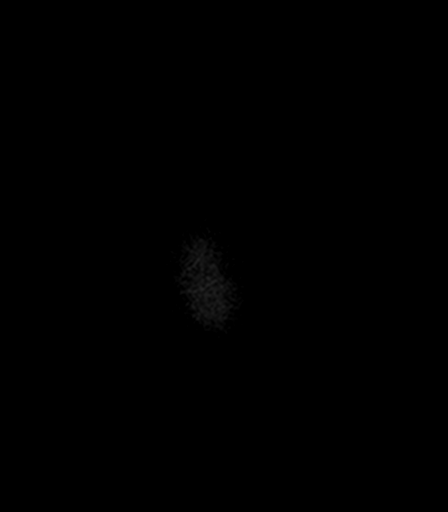

[Series 9: T2 · axial · 5.0mm · 0.45mm/px · z∈[-62,+102]mm · 3 of 27 slices shown (2 of 3)]
[im 1/27]
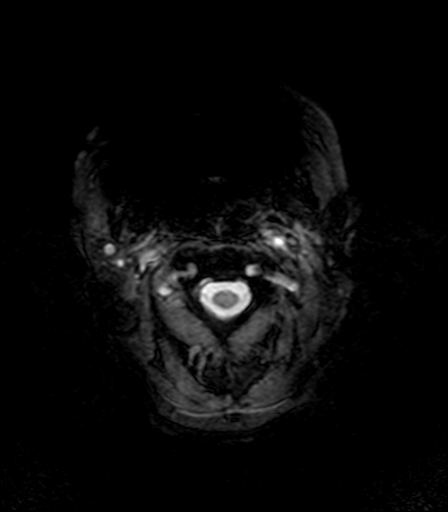
[im 14/27]
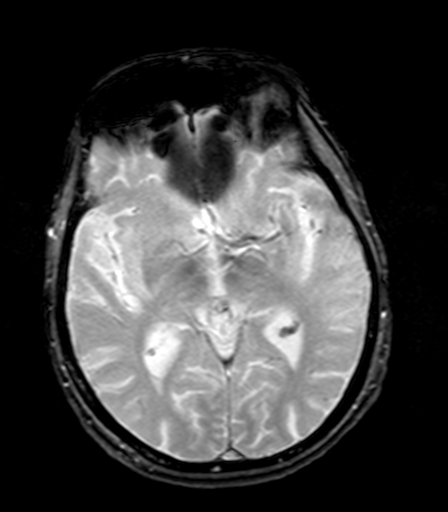
[im 27/27]
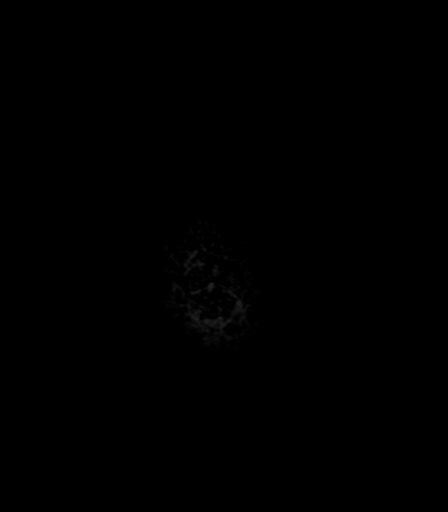

[Series 10: T1 · axial · 3.0mm · 1.00mm/px · z∈[-63,+109]mm · 7 of 60 slices shown (2 of 2)]
[im 1/60]
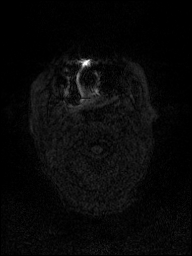
[im 10/60]
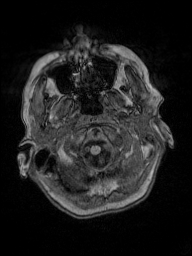
[im 20/60]
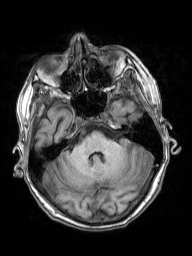
[im 30/60]
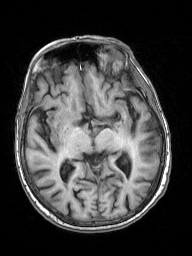
[im 40/60]
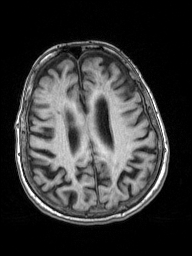
[im 50/60]
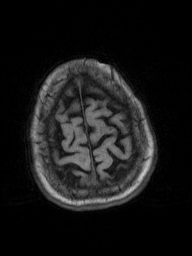
[im 60/60]
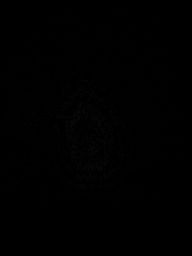

[Series 11: T2 · coronal · 5.0mm · 0.49mm/px · 4 of 31 slices shown (3 of 3)]
[im 1/31]
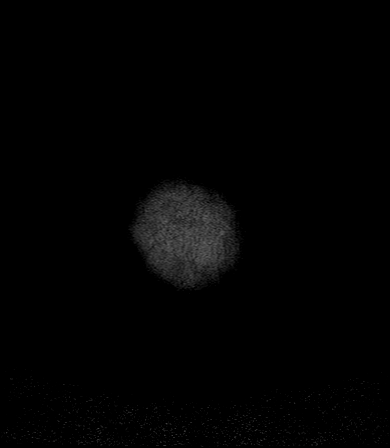
[im 11/31]
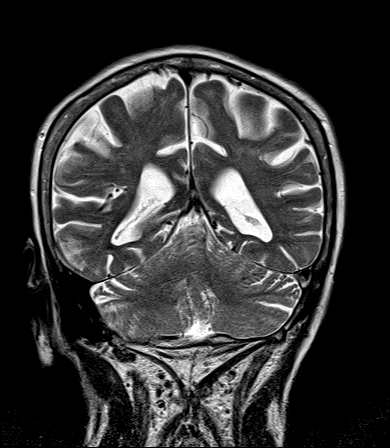
[im 21/31]
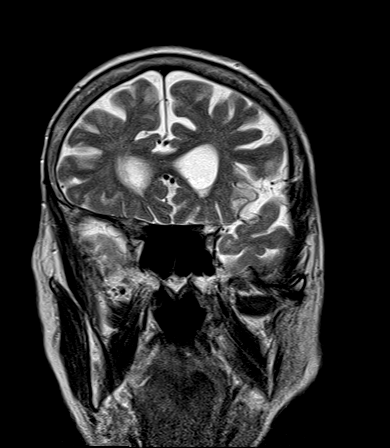
[im 31/31]
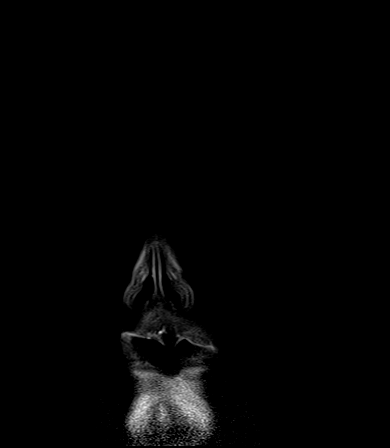

[Series 100: DWI · axial · 3.0mm · 1.80mm/px · z∈[-54,+103]mm · 6 of 54 slices shown (3 of 4)]
[im 1/54]
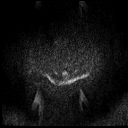
[im 11/54]
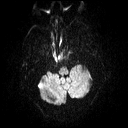
[im 22/54]
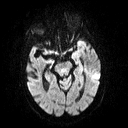
[im 32/54]
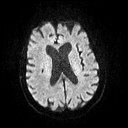
[im 43/54]
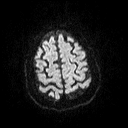
[im 54/54]
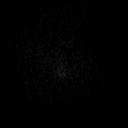

[Series 101: DWI · coronal · 3.0mm · 1.80mm/px · 6 of 48 slices shown (4 of 4)]
[im 1/48]
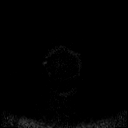
[im 10/48]
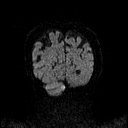
[im 19/48]
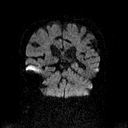
[im 29/48]
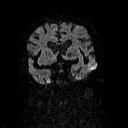
[im 38/48]
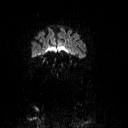
[im 48/48]
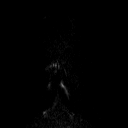

[48 of 48 positions shown; findings below may reference images not displayed]

FINDINGS: Brain: Moderate atrophy. Negative for acute infarct. Mild chronic
microvascular ischemic change in the white matter. Chronic infarct
in the right thalamus. Mild chronic ischemia in the pons. Pituitary
normal in size.

Chronic microhemorrhage right frontal white matter and right
posterior limb internal capsule. No fluid collection or mass. No
shift of the midline structures.

Vascular: Normal arterial flow voids.

Skull and upper cervical spine: Negative

Sinuses/Orbits: Mild mucosal edema paranasal sinuses. Bilateral lens
replacement.

Other: None
IMPRESSION: Atrophy and chronic microvascular ischemia. Mild chronic
microhemorrhage in the right frontal lobe with right internal
capsule.

No acute abnormality.
